# Patient Record
Sex: Female | Born: 1975 | Race: Black or African American | Hispanic: No | Marital: Single | Smoking: Never smoker
Health system: Southern US, Community
[De-identification: ages and names within clinical notes are randomized; demographics above are authoritative.]

## PROBLEM LIST (undated history)

## (undated) DIAGNOSIS — Z789 Other specified health status: Secondary | ICD-10-CM

## (undated) HISTORY — PX: NO PAST SURGERIES: SHX2092

---

## 1999-06-24 ENCOUNTER — Emergency Department (HOSPITAL_COMMUNITY): Admission: EM | Admit: 1999-06-24 | Discharge: 1999-06-24 | Payer: Self-pay | Admitting: Emergency Medicine

## 2001-10-19 ENCOUNTER — Inpatient Hospital Stay (HOSPITAL_COMMUNITY): Admission: AD | Admit: 2001-10-19 | Discharge: 2001-10-19 | Payer: Self-pay | Admitting: Obstetrics and Gynecology

## 2004-04-06 ENCOUNTER — Emergency Department (HOSPITAL_COMMUNITY): Admission: EM | Admit: 2004-04-06 | Discharge: 2004-04-06 | Payer: Self-pay | Admitting: Emergency Medicine

## 2004-04-18 ENCOUNTER — Ambulatory Visit (HOSPITAL_COMMUNITY): Admission: RE | Admit: 2004-04-18 | Discharge: 2004-04-18 | Payer: Self-pay | Admitting: Obstetrics and Gynecology

## 2004-05-27 ENCOUNTER — Encounter: Admission: RE | Admit: 2004-05-27 | Discharge: 2004-05-27 | Payer: Self-pay | Admitting: *Deleted

## 2004-05-29 ENCOUNTER — Ambulatory Visit (HOSPITAL_COMMUNITY): Admission: RE | Admit: 2004-05-29 | Discharge: 2004-05-29 | Payer: Self-pay | Admitting: *Deleted

## 2004-05-29 ENCOUNTER — Ambulatory Visit: Payer: Self-pay | Admitting: Family Medicine

## 2004-06-05 ENCOUNTER — Ambulatory Visit: Payer: Self-pay | Admitting: Family Medicine

## 2004-06-12 ENCOUNTER — Ambulatory Visit: Payer: Self-pay | Admitting: Family Medicine

## 2004-06-26 ENCOUNTER — Ambulatory Visit: Payer: Self-pay | Admitting: Family Medicine

## 2004-07-03 ENCOUNTER — Ambulatory Visit: Payer: Self-pay | Admitting: Family Medicine

## 2004-07-10 ENCOUNTER — Ambulatory Visit (HOSPITAL_COMMUNITY): Admission: RE | Admit: 2004-07-10 | Discharge: 2004-07-10 | Payer: Self-pay | Admitting: *Deleted

## 2004-07-10 ENCOUNTER — Ambulatory Visit: Payer: Self-pay | Admitting: Family Medicine

## 2004-07-17 ENCOUNTER — Ambulatory Visit: Payer: Self-pay | Admitting: Family Medicine

## 2004-07-24 ENCOUNTER — Ambulatory Visit: Payer: Self-pay | Admitting: Family Medicine

## 2004-07-28 ENCOUNTER — Ambulatory Visit: Payer: Self-pay | Admitting: Obstetrics & Gynecology

## 2004-07-31 ENCOUNTER — Inpatient Hospital Stay (HOSPITAL_COMMUNITY): Admission: AD | Admit: 2004-07-31 | Discharge: 2004-08-04 | Payer: Self-pay | Admitting: *Deleted

## 2004-07-31 ENCOUNTER — Ambulatory Visit: Payer: Self-pay | Admitting: Obstetrics and Gynecology

## 2006-12-01 ENCOUNTER — Emergency Department (HOSPITAL_COMMUNITY): Admission: EM | Admit: 2006-12-01 | Discharge: 2006-12-01 | Payer: Self-pay | Admitting: Emergency Medicine

## 2008-08-23 ENCOUNTER — Ambulatory Visit: Payer: Self-pay | Admitting: Obstetrics & Gynecology

## 2008-08-29 ENCOUNTER — Other Ambulatory Visit: Payer: Self-pay | Admitting: Obstetrics & Gynecology

## 2008-09-20 ENCOUNTER — Ambulatory Visit: Payer: Self-pay | Admitting: Family Medicine

## 2008-09-20 ENCOUNTER — Other Ambulatory Visit: Admission: RE | Admit: 2008-09-20 | Discharge: 2008-09-20 | Payer: Self-pay | Admitting: Obstetrics & Gynecology

## 2008-10-04 ENCOUNTER — Ambulatory Visit: Payer: Self-pay | Admitting: Obstetrics and Gynecology

## 2008-11-10 ENCOUNTER — Emergency Department (HOSPITAL_COMMUNITY): Admission: EM | Admit: 2008-11-10 | Discharge: 2008-11-10 | Payer: Self-pay | Admitting: Emergency Medicine

## 2009-04-24 ENCOUNTER — Encounter: Payer: Self-pay | Admitting: Obstetrics and Gynecology

## 2009-04-24 ENCOUNTER — Ambulatory Visit: Payer: Self-pay | Admitting: Obstetrics and Gynecology

## 2009-06-21 ENCOUNTER — Ambulatory Visit: Payer: Self-pay | Admitting: Obstetrics & Gynecology

## 2009-09-25 ENCOUNTER — Ambulatory Visit (HOSPITAL_COMMUNITY): Admission: RE | Admit: 2009-09-25 | Discharge: 2009-09-25 | Payer: Self-pay | Admitting: Obstetrics & Gynecology

## 2010-01-31 ENCOUNTER — Inpatient Hospital Stay (HOSPITAL_COMMUNITY): Admission: AD | Admit: 2010-01-31 | Discharge: 2010-02-02 | Payer: Self-pay | Admitting: Obstetrics & Gynecology

## 2010-01-31 ENCOUNTER — Ambulatory Visit: Payer: Self-pay | Admitting: Advanced Practice Midwife

## 2010-08-28 LAB — CBC
HCT: 36.4 % (ref 36.0–46.0)
Hemoglobin: 11.3 g/dL — ABNORMAL LOW (ref 12.0–15.0)
MCH: 26.7 pg (ref 26.0–34.0)
MCHC: 31.1 g/dL (ref 30.0–36.0)
MCV: 85.9 fL (ref 78.0–100.0)
Platelets: 240 10*3/uL (ref 150–400)
RBC: 4.23 MIL/uL (ref 3.87–5.11)
RDW: 14.3 % (ref 11.5–15.5)
WBC: 7.2 10*3/uL (ref 4.0–10.5)

## 2010-08-28 LAB — RPR: RPR Ser Ql: NONREACTIVE

## 2010-09-24 LAB — POCT PREGNANCY, URINE: Preg Test, Ur: NEGATIVE

## 2010-09-25 LAB — POCT PREGNANCY, URINE: Preg Test, Ur: NEGATIVE

## 2010-10-28 NOTE — Group Therapy Note (Signed)
NAME:  Brooke Lewis, Brooke Lewis NO.:  [id number]   MEDICAL RECORD NO.:  [id number]          PATIENT TYPE:  WOC   LOCATION:  [facility]                   FACILITY:  [facility]   PHYSICIAN:  Argentina Donovan, MD        DATE OF BIRTH:  1975-10-05   DATE OF SERVICE:  09/20/2008                                  CLINIC NOTE   REASON FOR VISIT:  For LEEP procedure.   HISTORY OF PRESENT ILLNESS:  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  The patient does have a history of cryotherapy and her last colposcopy  performed in January 2010 revealed some acetowhite epithelia, was  somewhat concerning for high-grade lesion.  The patient has previously  been counseled on a LEEP and has watched her video.   PROCEDURE NOTE:  Informed consent was obtained and is on the chart.  Then, a speculum was inserted to the patient's vagina and clear  visualization of the cervix was noted.  Under colposcopic exam, no  abnormal blood vessels were noted.  The transformation zone was seen 360  degrees and acetic acid solution was applied to the cervix.  Acetowhite  epithelium was noted at 12 o'clock and 6 o'clock positions.  A  paracervical block was performed using 10 mL of 1% lidocaine with  epinephrine.  Using the Fischer loop electrode, the usual biopsy was  taken from the cervix.  The edges of the biopsy sites were then  cauterized.  Areas of bleeding inside the biopsy site was also  cauterized.  The Monsel solution was applied.  Good hemostasis was  noted.  The patient tolerated procedure well.  Blood loss was minimal.  Procedure was supervised by Dr. Okey Dupre.     ______________________________  Odie Sera, D.O.    ______________________________  Argentina Donovan, MD    MC/MEDQ  D:  09/20/2008  T:  09/21/2008  Job:  [id number]

## 2011-01-01 LAB — HEPATITIS B SURFACE ANTIGEN: Hepatitis B Surface Ag: NEGATIVE

## 2011-01-01 LAB — ABO/RH

## 2011-01-01 LAB — RUBELLA ANTIBODY, IGM: Rubella: IMMUNE

## 2011-01-01 LAB — ANTIBODY SCREEN: Antibody Screen: NEGATIVE

## 2011-04-01 LAB — URINE MICROSCOPIC-ADD ON

## 2011-04-01 LAB — WET PREP, GENITAL: Trich, Wet Prep: NONE SEEN

## 2011-04-01 LAB — GC/CHLAMYDIA PROBE AMP, GENITAL
Chlamydia, DNA Probe: POSITIVE — AB
GC Probe Amp, Genital: NEGATIVE

## 2011-04-01 LAB — URINALYSIS, ROUTINE W REFLEX MICROSCOPIC
Bilirubin Urine: NEGATIVE
Glucose, UA: NEGATIVE
Hgb urine dipstick: NEGATIVE
Ketones, ur: NEGATIVE
Nitrite: NEGATIVE
Protein, ur: NEGATIVE
Specific Gravity, Urine: 1.015
Urobilinogen, UA: 0.2
pH: 5.5

## 2011-04-01 LAB — RPR: RPR Ser Ql: NONREACTIVE

## 2011-04-01 LAB — PREGNANCY, URINE: Preg Test, Ur: NEGATIVE

## 2011-04-28 LAB — GC/CHLAMYDIA PROBE AMP, GENITAL: Gonorrhea: NEGATIVE

## 2011-06-03 LAB — HIV ANTIBODY (ROUTINE TESTING W REFLEX): HIV: NONREACTIVE

## 2011-06-16 NOTE — L&D Delivery Note (Signed)
Delivery Note At 3:03 AM a viable female was delivered via Vaginal, Spontaneous Delivery (Presentation: ;  ).  APGAR: , ; weight 7 lb 7 oz (3374 g).   Placenta status: , .  Cord:  with the following complications: .  Cord pH: not done  Anesthesia:   Episiotomy:  Lacerations:  Suture Repair: 2.0 Est. Blood Loss (mL):   Mom to postpartum.  Baby to nursery-stable.  Joshue Badal A 09/04/2011, 3:16 AM

## 2011-08-18 LAB — STREP B DNA PROBE: GBS: POSITIVE

## 2011-09-04 ENCOUNTER — Encounter (HOSPITAL_COMMUNITY): Payer: Self-pay

## 2011-09-04 ENCOUNTER — Inpatient Hospital Stay (HOSPITAL_COMMUNITY)
Admission: AD | Admit: 2011-09-04 | Discharge: 2011-09-06 | DRG: 775 | Disposition: A | Discharge status: Home / Self Care | Payer: Medicaid Other | Source: Ambulatory Visit | Attending: Obstetrics | Admitting: Obstetrics

## 2011-09-04 HISTORY — DX: Other specified health status: Z78.9

## 2011-09-04 LAB — CBC
Platelets: 247 10*3/uL (ref 150–400)
RBC: 4.34 MIL/uL (ref 3.87–5.11)
WBC: 7.8 10*3/uL (ref 4.0–10.5)

## 2011-09-04 LAB — RPR: RPR Ser Ql: NONREACTIVE

## 2011-09-04 MED ORDER — ONDANSETRON HCL 4 MG/2ML IJ SOLN: 4.0000 mg | INTRAMUSCULAR | Status: DC | PRN

## 2011-09-04 MED ORDER — BENZOCAINE-MENTHOL 20-0.5 % EX AERO: 1.0000 "application " | INHALATION_SPRAY | CUTANEOUS | Status: DC | PRN

## 2011-09-04 MED ORDER — LACTATED RINGERS IV SOLN
500.0000 mL | INTRAVENOUS | Status: DC | PRN
Start: 2011-09-04 — End: 2011-09-04

## 2011-09-04 MED ORDER — FLEET ENEMA 7-19 GM/118ML RE ENEM: 1.0000 | ENEMA | RECTAL | Status: DC | PRN

## 2011-09-04 MED ORDER — SENNOSIDES-DOCUSATE SODIUM 8.6-50 MG PO TABS
2.0000 | ORAL_TABLET | Freq: Every day | ORAL | Status: DC
  Administered 2011-09-05 (×2): 2 via ORAL

## 2011-09-04 MED ORDER — LANOLIN HYDROUS EX OINT: TOPICAL_OINTMENT | CUTANEOUS | Status: DC | PRN

## 2011-09-04 MED ORDER — CITRIC ACID-SODIUM CITRATE 334-500 MG/5ML PO SOLN: 30.0000 mL | ORAL | Status: DC | PRN

## 2011-09-04 MED ORDER — OXYCODONE-ACETAMINOPHEN 5-325 MG PO TABS: 1.0000 | ORAL_TABLET | ORAL | Status: DC | PRN

## 2011-09-04 MED ORDER — ONDANSETRON HCL 4 MG PO TABS: 4.0000 mg | ORAL_TABLET | ORAL | Status: DC | PRN

## 2011-09-04 MED ORDER — OXYTOCIN BOLUS FROM INFUSION
500.0000 mL | Freq: Once | INTRAVENOUS | Status: DC
  Filled 2011-09-04: qty 500
  Filled 2011-09-04: qty 1000

## 2011-09-04 MED ORDER — TETANUS-DIPHTH-ACELL PERTUSSIS 5-2.5-18.5 LF-MCG/0.5 IM SUSP
0.5000 mL | Freq: Once | INTRAMUSCULAR | Status: AC
  Administered 2011-09-05: 0.5 mL via INTRAMUSCULAR
  Filled 2011-09-04: qty 0.5

## 2011-09-04 MED ORDER — LIDOCAINE HCL (PF) 1 % IJ SOLN: 30.0000 mL | INTRAMUSCULAR | Status: DC | PRN

## 2011-09-04 MED ORDER — OXYCODONE-ACETAMINOPHEN 5-325 MG PO TABS
1.0000 | ORAL_TABLET | ORAL | Status: DC | PRN
  Administered 2011-09-04: 1 via ORAL
  Filled 2011-09-04: qty 1

## 2011-09-04 MED ORDER — ZOLPIDEM TARTRATE 5 MG PO TABS: 5.0000 mg | ORAL_TABLET | Freq: Every evening | ORAL | Status: DC | PRN

## 2011-09-04 MED ORDER — ACETAMINOPHEN 325 MG PO TABS: 650.0000 mg | ORAL_TABLET | ORAL | Status: DC | PRN

## 2011-09-04 MED ORDER — IBUPROFEN 600 MG PO TABS
600.0000 mg | ORAL_TABLET | Freq: Four times a day (QID) | ORAL | Status: DC
  Administered 2011-09-04 – 2011-09-06 (×9): 600 mg via ORAL
  Filled 2011-09-04 (×11): qty 1

## 2011-09-04 MED ORDER — FERROUS SULFATE 325 (65 FE) MG PO TABS
325.0000 mg | ORAL_TABLET | Freq: Two times a day (BID) | ORAL | Status: DC
  Administered 2011-09-04 – 2011-09-06 (×4): 325 mg via ORAL
  Filled 2011-09-04 (×5): qty 1

## 2011-09-04 MED ORDER — LACTATED RINGERS IV SOLN: INTRAVENOUS | Status: DC

## 2011-09-04 MED ORDER — IBUPROFEN 600 MG PO TABS: 600.0000 mg | ORAL_TABLET | Freq: Four times a day (QID) | ORAL | Status: DC | PRN

## 2011-09-04 MED ORDER — BUTORPHANOL TARTRATE 2 MG/ML IJ SOLN: 1.0000 mg | INTRAMUSCULAR | Status: DC | PRN

## 2011-09-04 MED ORDER — DIPHENHYDRAMINE HCL 25 MG PO CAPS: 25.0000 mg | ORAL_CAPSULE | Freq: Four times a day (QID) | ORAL | Status: DC | PRN

## 2011-09-04 MED ORDER — SIMETHICONE 80 MG PO CHEW: 80.0000 mg | CHEWABLE_TABLET | ORAL | Status: DC | PRN

## 2011-09-04 MED ORDER — PRENATAL MULTIVITAMIN CH
1.0000 | ORAL_TABLET | Freq: Every day | ORAL | Status: DC
  Administered 2011-09-04 – 2011-09-06 (×3): 1 via ORAL
  Filled 2011-09-04 (×2): qty 1

## 2011-09-04 MED ORDER — DIBUCAINE 1 % RE OINT: 1.0000 "application " | TOPICAL_OINTMENT | RECTAL | Status: DC | PRN

## 2011-09-04 MED ORDER — ONDANSETRON HCL 4 MG/2ML IJ SOLN: 4.0000 mg | Freq: Four times a day (QID) | INTRAMUSCULAR | Status: DC | PRN

## 2011-09-04 MED ORDER — WITCH HAZEL-GLYCERIN EX PADS: 1.0000 "application " | MEDICATED_PAD | CUTANEOUS | Status: DC | PRN

## 2011-09-04 MED ORDER — OXYTOCIN 20 UNITS IN LACTATED RINGERS INFUSION - SIMPLE
125.0000 mL/h | Freq: Once | INTRAVENOUS | Status: AC
  Administered 2011-09-04: 125 mL/h via INTRAVENOUS

## 2011-09-04 NOTE — H&P (Signed)
This is Dr. Francoise Ceo dictating the history and physical on  Brooke Lewis she's a 36 year old gravida 3 para 2002 who is due date was 09/18/2011 she was admitted in labor and when she got to labor and delivery she was fully dilated her membranes ruptured spontaneously and the fluid was meconium-stained showed a normal vaginal liver a female Apgar 49 placenta was spontaneous in act there were no lacerations Past medical history negative Past surgical history negative Social history negative System review negative Physical exam well-developed female in no distress HEENT negative Breasts negative Heart regular with no murmurs no gallops Abdomen uterus 20 week size postpartum Extremities negative and and

## 2011-09-04 NOTE — Progress Notes (Signed)
UR Chart review completed.  

## 2011-09-04 NOTE — MAU Note (Signed)
Pt states having contractions 5 minutes apart since 0030 this am. Denies leaking of fluid. States having small amount of pinkish discharge

## 2011-09-04 NOTE — Progress Notes (Signed)
Nursing Note:  Fundal massage resulting in expulsion of a blood clot approximately the size of an orange. Patient tolerated massage well. Stated she felt better after clot was expelled. States she has history of postpartum hemorrhage with her second child. Educated patient about the importance of notifying the nurse if she had feelings of blood gushing or passing of  clots. Will continue to monitor for S/S of hemorrhage.   Brooke Lewis

## 2011-09-05 LAB — CBC
Hemoglobin: 9.6 g/dL — ABNORMAL LOW (ref 12.0–15.0)
MCH: 25.2 pg — ABNORMAL LOW (ref 26.0–34.0)
MCHC: 32 g/dL (ref 30.0–36.0)
Platelets: 237 10*3/uL (ref 150–400)
RDW: 16.1 % — ABNORMAL HIGH (ref 11.5–15.5)

## 2011-09-05 NOTE — Progress Notes (Signed)
Patient ID: Brooke Lewis, female   DOB: [birth year], 36 y.o.   MRN: [id number] Post Partum Day 1 S/P spontaneous vaginal RH status/Rubella reviewed.  Feeding: breast Subjective: No HA, SOB, CP, F/C, breast symptoms. Normal vaginal bleeding, no clots.     Objective: BP 104/66  Pulse 81  Temp(Src) 98 F (36.7 C) (Oral)  Resp 20  Ht 5\' 7"  (1.702 m)  Wt 94.348 kg (208 lb)  BMI 32.58 kg/m2  LMP 12/12/2010  Breastfeeding? Unknown   Physical Exam:  General: alert Lochia: appropriate Uterine Fundus: firm DVT Evaluation: No evidence of DVT seen on physical exam. Ext: No c/c/e  Basename 09/05/11 0500 09/04/11 0250  HGB 9.6* 10.8*  HCT 30.0* 33.9*      Assessment/Plan: 36 y.o.  PPD #1 .  normal postpartum exam Continue current postpartum care Ambulate   LOS: 1 day   JACKSON-MOORE,Kaio Kuhlman A 09/05/2011, 10:06 AM

## 2011-09-06 MED ORDER — OXYCODONE-ACETAMINOPHEN 5-325 MG PO TABS: 1.0000 | ORAL_TABLET | ORAL | Status: AC | PRN

## 2011-09-06 MED ORDER — PRENATAL MULTIVITAMIN CH: 1.0000 | ORAL_TABLET | Freq: Every morning | ORAL | Status: DC

## 2011-09-06 MED ORDER — NORETHINDRONE 0.35 MG PO TABS: 1.0000 | ORAL_TABLET | Freq: Every day | ORAL | Status: AC

## 2011-09-06 NOTE — Discharge Instructions (Signed)

## 2011-09-06 NOTE — Discharge Summary (Signed)
Obstetric Discharge Summary Reason for Admission: onset of labor Prenatal Procedures: none Intrapartum Procedures: spontaneous vaginal delivery Postpartum Procedures: none Complications-Operative and Postpartum: none Hemoglobin  Date Value Range Status  09/05/2011 9.6* 12.0-15.0 (g/dL) Final     HCT  Date Value Range Status  09/05/2011 30.0* 36.0-46.0 (%) Final    Physical Exam:  General: alert Lochia: appropriate Uterine Fundus: firm Incision: N/A DVT Evaluation: No evidence of DVT seen on physical exam.  Discharge Diagnoses: Term Pregnancy-delivered  Discharge Information: Date: 09/06/2011 Activity: pelvic rest Diet: routine Medications: PNV, Percocet and Micronor Condition: stable Instructions: See above Discharge to: home Follow-up Information    Follow up with Antionette Char A, MD. Schedule an appointment as soon as possible for a visit in 2 weeks.   Contact information:   [address], [address] [place] [address] [phone]          Newborn Data: Live born female  Birth Weight: 7 lb 7 oz (3374 g) APGAR: 9, 9  Home with mother.  Lewis,Brooke Nazario A 09/06/2011, 8:59 AM

## 2013-01-02 ENCOUNTER — Other Ambulatory Visit: Payer: Self-pay

## 2013-01-02 LAB — OB RESULTS CONSOLE HIV ANTIBODY (ROUTINE TESTING): HIV: NONREACTIVE

## 2013-01-02 LAB — OB RESULTS CONSOLE RPR: RPR: NONREACTIVE

## 2013-01-02 LAB — OB RESULTS CONSOLE ABO/RH: RH TYPE: POSITIVE

## 2013-01-02 LAB — OB RESULTS CONSOLE ANTIBODY SCREEN: ANTIBODY SCREEN: NEGATIVE

## 2013-01-02 LAB — OB RESULTS CONSOLE HEPATITIS B SURFACE ANTIGEN: Hepatitis B Surface Ag: NEGATIVE

## 2013-01-02 LAB — OB RESULTS CONSOLE GC/CHLAMYDIA
Chlamydia: NEGATIVE
Gonorrhea: NEGATIVE

## 2013-01-02 LAB — OB RESULTS CONSOLE RUBELLA ANTIBODY, IGM: Rubella: IMMUNE

## 2013-01-05 ENCOUNTER — Other Ambulatory Visit: Payer: Self-pay | Admitting: Nurse Practitioner

## 2013-01-05 DIAGNOSIS — O09529 Supervision of elderly multigravida, unspecified trimester: Secondary | ICD-10-CM

## 2013-01-10 ENCOUNTER — Encounter (HOSPITAL_COMMUNITY): Payer: Self-pay

## 2013-01-10 ENCOUNTER — Ambulatory Visit (HOSPITAL_COMMUNITY)
Admission: RE | Admit: 2013-01-10 | Discharge: 2013-01-10 | Disposition: A | Discharge status: Home / Self Care | Payer: Medicaid Other | Source: Ambulatory Visit | Attending: Nurse Practitioner | Admitting: Nurse Practitioner

## 2013-01-10 ENCOUNTER — Other Ambulatory Visit: Payer: Self-pay

## 2013-01-10 DIAGNOSIS — Z8751 Personal history of pre-term labor: Secondary | ICD-10-CM | POA: Insufficient documentation

## 2013-01-10 DIAGNOSIS — Z363 Encounter for antenatal screening for malformations: Secondary | ICD-10-CM | POA: Insufficient documentation

## 2013-01-10 DIAGNOSIS — O289 Unspecified abnormal findings on antenatal screening of mother: Secondary | ICD-10-CM | POA: Insufficient documentation

## 2013-01-10 DIAGNOSIS — O358XX0 Maternal care for other (suspected) fetal abnormality and damage, not applicable or unspecified: Secondary | ICD-10-CM | POA: Insufficient documentation

## 2013-01-10 DIAGNOSIS — O09529 Supervision of elderly multigravida, unspecified trimester: Secondary | ICD-10-CM | POA: Insufficient documentation

## 2013-01-10 DIAGNOSIS — Z1389 Encounter for screening for other disorder: Secondary | ICD-10-CM | POA: Insufficient documentation

## 2013-01-10 DIAGNOSIS — O09299 Supervision of pregnancy with other poor reproductive or obstetric history, unspecified trimester: Secondary | ICD-10-CM | POA: Insufficient documentation

## 2013-01-10 NOTE — Progress Notes (Addendum)
Genetic Counseling  High-Risk Gestation Note  Appointment Date:  01/10/2013 Referred By: Brooke Lewis,* Date of Birth:  [birth year] Partner:  Brooke Lewis    Pregnancy History: [id number] Estimated Date of Delivery: 06/22/13 Estimated Gestational Age: [redacted]w[redacted]d Attending: Particia Nearing, MD    [name] and her partner, Mr. Brooke Lewis, were seen for genetic counseling regarding a maternal age of 37 y.o. and an increased risk for Down syndrome based on Quad screen performed through [facility].  They were counseled regarding maternal age and the association with risk for chromosome conditions due to nondisjunction with aging of the ova.   We reviewed chromosomes, nondisjunction, and the associated 1 in 141 risk for fetal aneuploidy related to a maternal age of 37 y.o. at [redacted]w[redacted]d gestation.  They were counseled that the risk for aneuploidy decreases as gestational age increases, accounting for those pregnancies which spontaneously abort.  We specifically discussed Down syndrome (trisomy 71), trisomies 24 and 80, and sex chromosome aneuploidies (47,XXX and 47,XXY) including the common features and prognoses of each.   We also reviewed Brooke Lewis's maternal serum Quad screen result and the associated increase in risk for fetal Down syndrome (1 in 392 to 1 in 200).  We reviewed that the initial result indicated a risk of 1 in 8 for fetal Down syndrome. However, today's ultrasound visualized the pregnancy to be [redacted]w[redacted]d gestation, and thus, the Quad screen was recalculated with this corrected gestational age, giving a risk of 1 in 200 for fetal Down syndrome. They were counseled regarding other explanations for a screen positive result including normal variation and differences in maternal metabolism.  In addition, we reviewed the screen adjusted reduction in risks for trisomy 18 (1 in 1,179 to < 1 in 10,000) and ONTDs.  They understand that Quad screening provides a  pregnancy specific risk for Down syndrome, but is not considered to be diagnostic.    We reviewed other available screening options including noninvasive prenatal screening (NIPS)/cell free fetal DNA testing and detailed ultrasound.  Specifically, we discussed that NIPS analyzes cell free fetal DNA found in the maternal circulation. This test is not diagnostic for chromosome conditions, but can provide information regarding the presence or absence of extra fetal DNA for chromosomes 13, 18, 21, X, and Y, and missing fetal DNA for chromosome X and Y (Turner syndrome). Thus, it would not identify or rule out all genetic conditions. The reported detection rate is greater than 99% for Trisomy 21, Trisomy 18, and Trisomy 13. The false positive rate is reported to be less than 0.1% for any of these conditions.  We reviewed the risks, benefits, and limitations of these screening options.   This couple was also counseled regarding diagnostic testing via amniocentesis.  We reviewed the approximate 1 in 300-500 risk for complications, including spontaneous pregnancy loss. After consideration of all the options, she elected to proceed with NIPS (Panorama) at the time of today's visit. Those results will be available in approximately 8-10 days.  Detailed ultrasound was performed today.  The ultrasound report will be sent under separate cover. Follow-up ultrasound was planned in 4 weeks.   [name] was provided with written information regarding sickle cell anemia (SCA) including the carrier frequency and incidence in the African-American population, the availability of carrier testing and prenatal diagnosis if indicated. 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  We discussed that SCA affects the shape and function of the red blood cell  by producing abnormal hemoglobin. They were counseled that hemoglobin is a protein in the RBCs that carries oxygen to the body's organs. Individuals who have  SCA have a changes within the genes that codes for hemoglobin. This couple was counseled that SCA is inherited in an autosomal recessive manner, and occurs when both copies of the hemoglobin gene are changed and produce an abnormal hemoglobin S. Typically, one abnormal gene for the production of hemoglobin S is inherited from each parent. A carrier of SCA has one altered copy of the gene for hemoglobin and one typical working copy. Carriers of recessive conditions typically do not have symptoms related to the condition because they still have one functioning copy of the gene, and thus some production of the typical protein coded for by that gene.  Given the recessive inheritance, we discussed the importance of understanding Mr. Brooke Lewis carrier status in order to accurately predict the risk of a hemoglobinopathy in the fetus. The father of the pregnancy reported that he has had testing for SCA in the past and that he does NOT have sickle cell trait. He did not have medical records available to verify the reported information. We discussed that other hemoglobin variants (hemoglobin C), when combined with hemoglobin S, can cause a medically significant hemoglobinopathy. Repeat testing (hemoglobin electrophoresis) is available, if desired, to the father of the pregnancy to determine whether he has any hemoglobin variant, including sickle cell trait (SCT).  They understand that an accurate risk assessment cannot be performed without further information. However, assuming that the father of the pregnancy does not have sickle cell trait or any other hemoglobin variant, the fetus would not be expected to have an increased risk for a hemoglobinopathy. We also reviewed the availability of newborn screening in [place] for hemoglobinopathies.   Both family histories were reviewed and found to be noncontributory for birth defects, mental retardation, and known genetic conditions. Without further information regarding  the provided family history, an accurate genetic risk cannot be calculated. Further genetic counseling is warranted if more information is obtained.  [name] denied exposure to environmental toxins or chemical agents. She denied the use of alcohol, tobacco or street drugs. She denied significant viral illnesses during the course of her pregnancy. Her medical and surgical histories were noncontributory.     I counseled this couple regarding the above risks and available options.  The approximate face-to-face time with the genetic counselor was 30 minutes.    Quinn Plowman, MS,  Certified Genetic Counselor 01/10/2013

## 2013-01-19 ENCOUNTER — Telehealth (HOSPITAL_COMMUNITY): Payer: Self-pay | Admitting: MS"

## 2013-01-19 NOTE — Telephone Encounter (Signed)
Called Keena Dinse to discuss her cell free fetal DNA test results.  Mrs. Arby Barrette had Panorama testing through [facility].  Testing was offered because of advanced maternal age and abnormal Quad screen result.   The patient was identified by name and DOB.  We reviewed that these are within normal limits, showing a less than 1 in 10,000 risk for trisomies 21, 18 and 13, and monosomy X (Turner syndrome).  In addition, the risk for triploidy/vanishing twin and sex chromosome trisomies (47,XXX and 47,XXY) was also low risk. We reviewed that this testing identifies > 99% of pregnancies with trisomy 23, trisomy 68, trisomy 37, sex chromosome trisomies (47,XXX and 47,XXY), and triploidy.  The detection rate for monosomy X is ~92%.  The false positive rate is <0.1% for all conditions. Testing was also consistent with female  gender.  We discussed that this testing does not identify all genetic conditions.  All questions were answered to her satisfaction, she was encouraged to call with additional questions or concerns.  Quinn Plowman, MS Certified Genetic Counselor 01/19/2013 11:17 AM

## 2013-02-06 ENCOUNTER — Other Ambulatory Visit: Payer: Self-pay | Admitting: Nurse Practitioner

## 2013-02-06 DIAGNOSIS — O289 Unspecified abnormal findings on antenatal screening of mother: Secondary | ICD-10-CM

## 2013-02-06 DIAGNOSIS — O09529 Supervision of elderly multigravida, unspecified trimester: Secondary | ICD-10-CM

## 2013-02-07 ENCOUNTER — Ambulatory Visit (HOSPITAL_COMMUNITY)
Admission: RE | Admit: 2013-02-07 | Discharge: 2013-02-07 | Disposition: A | Discharge status: Home / Self Care | Payer: Medicaid Other | Source: Ambulatory Visit | Attending: Nurse Practitioner | Admitting: Nurse Practitioner

## 2013-02-07 VITALS — BP 104/60 | HR 82 | Wt 208.0 lb

## 2013-02-07 DIAGNOSIS — O09299 Supervision of pregnancy with other poor reproductive or obstetric history, unspecified trimester: Secondary | ICD-10-CM | POA: Insufficient documentation

## 2013-02-07 DIAGNOSIS — O09529 Supervision of elderly multigravida, unspecified trimester: Secondary | ICD-10-CM | POA: Insufficient documentation

## 2013-02-07 DIAGNOSIS — Z8751 Personal history of pre-term labor: Secondary | ICD-10-CM | POA: Insufficient documentation

## 2013-02-07 DIAGNOSIS — Z3689 Encounter for other specified antenatal screening: Secondary | ICD-10-CM | POA: Insufficient documentation

## 2013-02-07 DIAGNOSIS — O289 Unspecified abnormal findings on antenatal screening of mother: Secondary | ICD-10-CM

## 2013-02-07 NOTE — Progress Notes (Signed)
Brooke Lewis  was seen today for an ultrasound appointment.  See full report in [facility].  Impression: Single IUP at 20 5/7 weeks AMA, increased risk for T21 by Quad screen with low risk cell free fetal DNA Normal interval anatomy; the fetal anatomic survey is now complete No markers associated with aneuploidy noted Normal amniotic fluic volume  Recommendations: Follow-up ultrasounds as clinically indicated.   Alpha Gula, MD

## 2013-05-04 ENCOUNTER — Encounter: Payer: Self-pay | Admitting: *Deleted

## 2013-06-02 LAB — OB RESULTS CONSOLE GBS: STREP GROUP B AG: POSITIVE

## 2013-06-15 NOTE — L&D Delivery Note (Signed)
Delivery Note At 4:49 AM a viable female was delivered via Vaginal, Spontaneous Delivery (Presentation: Left Occiput Anterior).  APGAR: 9,9 ; weight 6+6 lbs.   Placenta status: Intact, no evidence of retained pieces or abruption, sent for pathology.  Cord: 3 vessels; also subjectively short in length.  Anesthesia: None  Episiotomy: None Lacerations: None  Suture Repair: N/A Est. Blood Loss (mL): 300 mL   Mom to postpartum.  Baby to Couplet care / Skin to Skin.  Twana FirstBryan R. Hess, DO [facility] 06/22/2013, 5:04 AM  I have seen and examined this patient and I agree with the above. Plans for BTL later this AM- will be kept NPO. Cam HaiSHAW, Omeed Osuna 5:20 AM 06/22/2013

## 2013-06-15 NOTE — L&D Delivery Note (Signed)
Attestation of Attending Supervision of Advanced Practitioner: Evaluation and management procedures were performed by the PA/NP/CNM/OB Fellow under my supervision/collaboration. Chart reviewed and agree with management and plan.  Cesily Cuoco V 06/27/2013 10:45 AM

## 2013-06-21 ENCOUNTER — Encounter (HOSPITAL_COMMUNITY): Payer: Self-pay | Admitting: *Deleted

## 2013-06-21 ENCOUNTER — Inpatient Hospital Stay (HOSPITAL_COMMUNITY)
Admission: AD | Admit: 2013-06-21 | Discharge: 2013-06-23 | DRG: 767 | Disposition: A | Discharge status: Home / Self Care | Payer: Medicaid Other | Source: Ambulatory Visit | Attending: Obstetrics and Gynecology | Admitting: Obstetrics and Gynecology

## 2013-06-21 DIAGNOSIS — Z302 Encounter for sterilization: Secondary | ICD-10-CM

## 2013-06-21 DIAGNOSIS — O9902 Anemia complicating childbirth: Principal | ICD-10-CM | POA: Diagnosis present

## 2013-06-21 DIAGNOSIS — IMO0001 Reserved for inherently not codable concepts without codable children: Secondary | ICD-10-CM

## 2013-06-21 DIAGNOSIS — O9989 Other specified diseases and conditions complicating pregnancy, childbirth and the puerperium: Secondary | ICD-10-CM

## 2013-06-21 DIAGNOSIS — O99892 Other specified diseases and conditions complicating childbirth: Secondary | ICD-10-CM | POA: Diagnosis present

## 2013-06-21 DIAGNOSIS — O09529 Supervision of elderly multigravida, unspecified trimester: Secondary | ICD-10-CM | POA: Diagnosis present

## 2013-06-21 DIAGNOSIS — Z22330 Carrier of Group B streptococcus: Secondary | ICD-10-CM

## 2013-06-21 DIAGNOSIS — D573 Sickle-cell trait: Secondary | ICD-10-CM | POA: Diagnosis present

## 2013-06-21 HISTORY — DX: Other specified health status: Z78.9

## 2013-06-21 NOTE — MAU Note (Signed)
Pt states she has been having contractions since 1400. Pt states she was seen at the clinic on Monday

## 2013-06-22 ENCOUNTER — Encounter (HOSPITAL_COMMUNITY): Payer: Medicaid Other | Admitting: Anesthesiology

## 2013-06-22 ENCOUNTER — Encounter (HOSPITAL_COMMUNITY)
Admission: AD | Disposition: A | Discharge status: Home / Self Care | Payer: Self-pay | Source: Ambulatory Visit | Attending: Obstetrics and Gynecology

## 2013-06-22 ENCOUNTER — Encounter (HOSPITAL_COMMUNITY): Payer: Self-pay

## 2013-06-22 ENCOUNTER — Inpatient Hospital Stay (HOSPITAL_COMMUNITY): Payer: Medicaid Other | Admitting: Anesthesiology

## 2013-06-22 DIAGNOSIS — O9989 Other specified diseases and conditions complicating pregnancy, childbirth and the puerperium: Secondary | ICD-10-CM

## 2013-06-22 DIAGNOSIS — Z302 Encounter for sterilization: Secondary | ICD-10-CM

## 2013-06-22 DIAGNOSIS — O9902 Anemia complicating childbirth: Secondary | ICD-10-CM

## 2013-06-22 DIAGNOSIS — IMO0001 Reserved for inherently not codable concepts without codable children: Secondary | ICD-10-CM

## 2013-06-22 DIAGNOSIS — O09529 Supervision of elderly multigravida, unspecified trimester: Secondary | ICD-10-CM

## 2013-06-22 DIAGNOSIS — O99892 Other specified diseases and conditions complicating childbirth: Secondary | ICD-10-CM

## 2013-06-22 DIAGNOSIS — D573 Sickle-cell trait: Secondary | ICD-10-CM

## 2013-06-22 HISTORY — PX: TUBAL LIGATION: SHX77

## 2013-06-22 LAB — CBC
HCT: 36.5 % (ref 36.0–46.0)
HEMOGLOBIN: 12.4 g/dL (ref 12.0–15.0)
MCH: 27.3 pg (ref 26.0–34.0)
MCHC: 34 g/dL (ref 30.0–36.0)
MCV: 80.2 fL (ref 78.0–100.0)
PLATELETS: 226 10*3/uL (ref 150–400)
RBC: 4.55 MIL/uL (ref 3.87–5.11)
RDW: 14.6 % (ref 11.5–15.5)
WBC: 7.6 10*3/uL (ref 4.0–10.5)

## 2013-06-22 LAB — RPR: RPR Ser Ql: NONREACTIVE

## 2013-06-22 SURGERY — LIGATION, FALLOPIAN TUBE, POSTPARTUM
Anesthesia: Spinal | Site: Abdomen

## 2013-06-22 MED ORDER — PENICILLIN G POTASSIUM 5000000 UNITS IJ SOLR
2.5000 10*6.[IU] | INTRAVENOUS | Status: DC
  Filled 2013-06-22 (×6): qty 2.5

## 2013-06-22 MED ORDER — DIBUCAINE 1 % RE OINT: 1.0000 "application " | TOPICAL_OINTMENT | RECTAL | Status: DC | PRN

## 2013-06-22 MED ORDER — OXYCODONE-ACETAMINOPHEN 5-325 MG PO TABS
1.0000 | ORAL_TABLET | ORAL | Status: DC | PRN
Start: 2013-06-22 — End: 2013-06-24

## 2013-06-22 MED ORDER — LACTATED RINGERS IV SOLN: 500.0000 mL | INTRAVENOUS | Status: DC | PRN

## 2013-06-22 MED ORDER — IBUPROFEN 600 MG PO TABS
600.0000 mg | ORAL_TABLET | Freq: Four times a day (QID) | ORAL | Status: DC | PRN
  Administered 2013-06-22: 600 mg via ORAL
  Filled 2013-06-22: qty 1

## 2013-06-22 MED ORDER — WITCH HAZEL-GLYCERIN EX PADS: 1.0000 "application " | MEDICATED_PAD | CUTANEOUS | Status: DC | PRN

## 2013-06-22 MED ORDER — LACTATED RINGERS IV SOLN
INTRAVENOUS | Status: DC
  Administered 2013-06-22 (×4): via INTRAVENOUS

## 2013-06-22 MED ORDER — ONDANSETRON HCL 4 MG/2ML IJ SOLN: 4.0000 mg | INTRAMUSCULAR | Status: DC | PRN

## 2013-06-22 MED ORDER — ACETAMINOPHEN 325 MG PO TABS: 650.0000 mg | ORAL_TABLET | ORAL | Status: DC | PRN

## 2013-06-22 MED ORDER — PROMETHAZINE HCL 25 MG/ML IJ SOLN: 6.2500 mg | INTRAMUSCULAR | Status: DC | PRN

## 2013-06-22 MED ORDER — FENTANYL CITRATE 0.05 MG/ML IJ SOLN
INTRAMUSCULAR | Status: AC
  Filled 2013-06-22: qty 2

## 2013-06-22 MED ORDER — MIDAZOLAM HCL 2 MG/2ML IJ SOLN: 0.5000 mg | Freq: Once | INTRAMUSCULAR | Status: DC | PRN

## 2013-06-22 MED ORDER — OXYTOCIN 40 UNITS IN LACTATED RINGERS INFUSION - SIMPLE MED
62.5000 mL/h | INTRAVENOUS | Status: DC
  Filled 2013-06-22: qty 1000

## 2013-06-22 MED ORDER — FENTANYL CITRATE 0.05 MG/ML IJ SOLN: 50.0000 ug | INTRAMUSCULAR | Status: DC | PRN

## 2013-06-22 MED ORDER — OXYTOCIN BOLUS FROM INFUSION
500.0000 mL | INTRAVENOUS | Status: DC
  Administered 2013-06-22: 500 mL via INTRAVENOUS

## 2013-06-22 MED ORDER — CITRIC ACID-SODIUM CITRATE 334-500 MG/5ML PO SOLN
30.0000 mL | ORAL | Status: DC | PRN
  Administered 2013-06-22: 30 mL via ORAL
  Filled 2013-06-22: qty 15

## 2013-06-22 MED ORDER — LANOLIN HYDROUS EX OINT: TOPICAL_OINTMENT | CUTANEOUS | Status: DC | PRN

## 2013-06-22 MED ORDER — MEPERIDINE HCL 25 MG/ML IJ SOLN: 6.2500 mg | INTRAMUSCULAR | Status: DC | PRN

## 2013-06-22 MED ORDER — PENICILLIN G POTASSIUM 5000000 UNITS IJ SOLR
5.0000 10*6.[IU] | Freq: Once | INTRAVENOUS | Status: AC
  Administered 2013-06-22: 5 10*6.[IU] via INTRAVENOUS
  Filled 2013-06-22: qty 5

## 2013-06-22 MED ORDER — FENTANYL CITRATE 0.05 MG/ML IJ SOLN
INTRAMUSCULAR | Status: AC
  Administered 2013-06-22: 50 ug via INTRAVENOUS
  Filled 2013-06-22: qty 2

## 2013-06-22 MED ORDER — BUPIVACAINE HCL (PF) 0.5 % IJ SOLN
INTRAMUSCULAR | Status: AC
  Filled 2013-06-22: qty 30

## 2013-06-22 MED ORDER — SENNOSIDES-DOCUSATE SODIUM 8.6-50 MG PO TABS
2.0000 | ORAL_TABLET | ORAL | Status: DC
  Administered 2013-06-23: 2 via ORAL
  Filled 2013-06-22: qty 2

## 2013-06-22 MED ORDER — BUPIVACAINE IN DEXTROSE 0.75-8.25 % IT SOLN
INTRATHECAL | Status: DC | PRN
  Administered 2013-06-22: 1.6 mL via INTRATHECAL

## 2013-06-22 MED ORDER — FLEET ENEMA 7-19 GM/118ML RE ENEM: 1.0000 | ENEMA | RECTAL | Status: DC | PRN

## 2013-06-22 MED ORDER — SIMETHICONE 80 MG PO CHEW: 80.0000 mg | CHEWABLE_TABLET | ORAL | Status: DC | PRN

## 2013-06-22 MED ORDER — LIDOCAINE HCL (PF) 1 % IJ SOLN
30.0000 mL | INTRAMUSCULAR | Status: DC | PRN
  Filled 2013-06-22: qty 30

## 2013-06-22 MED ORDER — MIDAZOLAM HCL 2 MG/2ML IJ SOLN
INTRAMUSCULAR | Status: AC
  Filled 2013-06-22: qty 2

## 2013-06-22 MED ORDER — BUPIVACAINE HCL 0.5 % IJ SOLN
INTRAMUSCULAR | Status: DC | PRN
  Administered 2013-06-22: 20 mL

## 2013-06-22 MED ORDER — ONDANSETRON HCL 4 MG/2ML IJ SOLN: 4.0000 mg | Freq: Four times a day (QID) | INTRAMUSCULAR | Status: DC | PRN

## 2013-06-22 MED ORDER — IBUPROFEN 600 MG PO TABS
600.0000 mg | ORAL_TABLET | Freq: Four times a day (QID) | ORAL | Status: DC
  Administered 2013-06-22 – 2013-06-23 (×5): 600 mg via ORAL
  Filled 2013-06-22 (×5): qty 1

## 2013-06-22 MED ORDER — ONDANSETRON HCL 4 MG PO TABS: 4.0000 mg | ORAL_TABLET | ORAL | Status: DC | PRN

## 2013-06-22 MED ORDER — PRENATAL MULTIVITAMIN CH
1.0000 | ORAL_TABLET | Freq: Every day | ORAL | Status: DC
  Administered 2013-06-23: 1 via ORAL
  Filled 2013-06-22: qty 1

## 2013-06-22 MED ORDER — MIDAZOLAM HCL 2 MG/2ML IJ SOLN
INTRAMUSCULAR | Status: DC | PRN
  Administered 2013-06-22 (×2): 1 mg via INTRAVENOUS

## 2013-06-22 MED ORDER — KETOROLAC TROMETHAMINE 30 MG/ML IJ SOLN
15.0000 mg | Freq: Once | INTRAMUSCULAR | Status: AC | PRN
  Administered 2013-06-22: 30 mg via INTRAVENOUS

## 2013-06-22 MED ORDER — TETANUS-DIPHTH-ACELL PERTUSSIS 5-2.5-18.5 LF-MCG/0.5 IM SUSP: 0.5000 mL | Freq: Once | INTRAMUSCULAR | Status: DC

## 2013-06-22 MED ORDER — PRENATAL MULTIVITAMIN CH: 1.0000 | ORAL_TABLET | Freq: Every morning | ORAL | Status: DC

## 2013-06-22 MED ORDER — BENZOCAINE-MENTHOL 20-0.5 % EX AERO: 1.0000 "application " | INHALATION_SPRAY | CUTANEOUS | Status: DC | PRN

## 2013-06-22 MED ORDER — DIPHENHYDRAMINE HCL 25 MG PO CAPS: 25.0000 mg | ORAL_CAPSULE | Freq: Four times a day (QID) | ORAL | Status: DC | PRN

## 2013-06-22 MED ORDER — FENTANYL CITRATE 0.05 MG/ML IJ SOLN
25.0000 ug | INTRAMUSCULAR | Status: DC | PRN
  Administered 2013-06-22: 50 ug via INTRAVENOUS

## 2013-06-22 MED ORDER — ZOLPIDEM TARTRATE 5 MG PO TABS
5.0000 mg | ORAL_TABLET | Freq: Every evening | ORAL | Status: DC | PRN
Start: 2013-06-22 — End: 2013-06-24

## 2013-06-22 MED ORDER — OXYCODONE-ACETAMINOPHEN 5-325 MG PO TABS: 1.0000 | ORAL_TABLET | ORAL | Status: DC | PRN

## 2013-06-22 MED ORDER — KETOROLAC TROMETHAMINE 30 MG/ML IJ SOLN
INTRAMUSCULAR | Status: AC
  Filled 2013-06-22: qty 1

## 2013-06-22 MED ORDER — BUPIVACAINE IN DEXTROSE 0.75-8.25 % IT SOLN
INTRATHECAL | Status: AC
  Filled 2013-06-22: qty 2

## 2013-06-22 SURGICAL SUPPLY — 23 items
APL SKNCLS STERI-STRIP NONHPOA (GAUZE/BANDAGES/DRESSINGS) ×1
BENZOIN TINCTURE PRP APPL 2/3 (GAUZE/BANDAGES/DRESSINGS) ×2 IMPLANT
CATH ROBINSON RED A/P 16FR (CATHETERS) ×3 IMPLANT
CHLORAPREP W/TINT 26ML (MISCELLANEOUS) ×3 IMPLANT
CLIP FILSHIE TUBAL LIGA STRL (Clip) ×2 IMPLANT
CLOSURE WOUND 1/2 X4 (GAUZE/BANDAGES/DRESSINGS) ×1
CLOTH BEACON ORANGE TIMEOUT ST (SAFETY) ×3 IMPLANT
DRSG TEGADERM 2.38X2.75 (GAUZE/BANDAGES/DRESSINGS) ×2 IMPLANT
GLOVE ECLIPSE 7.0 STRL STRAW (GLOVE) ×3 IMPLANT
GLOVE INDICATOR 7.0 STRL GRN (GLOVE) ×6 IMPLANT
GOWN PREVENTION PLUS LG XLONG (DISPOSABLE) ×3 IMPLANT
GOWN STRL REIN XL XLG (GOWN DISPOSABLE) ×3 IMPLANT
NDL HYPO 25X1 1.5 SAFETY (NEEDLE) IMPLANT
NEEDLE HYPO 25X1 1.5 SAFETY (NEEDLE) ×3 IMPLANT
NS IRRIG 1000ML POUR BTL (IV SOLUTION) ×3 IMPLANT
PACK ABDOMINAL MINOR (CUSTOM PROCEDURE TRAY) ×3 IMPLANT
STRIP CLOSURE SKIN 1/2X4 (GAUZE/BANDAGES/DRESSINGS) ×1 IMPLANT
SUT VIC AB 0 CT1 27 (SUTURE) ×3
SUT VIC AB 0 CT1 27XBRD ANBCTR (SUTURE) ×1 IMPLANT
SUT VIC AB 4-0 PS2 27 (SUTURE) ×3 IMPLANT
SYR CONTROL 10ML LL (SYRINGE) ×2 IMPLANT
TOWEL OR 17X24 6PK STRL BLUE (TOWEL DISPOSABLE) ×6 IMPLANT
WATER STERILE IRR 1000ML POUR (IV SOLUTION) ×3 IMPLANT

## 2013-06-22 NOTE — Anesthesia Postprocedure Evaluation (Signed)
Anesthesia Post Note  Patient: Brooke BarretteDana Lewis  Procedure(s) Performed: Procedure(s) (LRB): POST PARTUM TUBAL LIGATION (N/A)  Anesthesia type: Epidural  Patient location: Mother/Baby  Post pain: Pain level controlled  Post assessment: Post-op Vital signs reviewed  Last Vitals:  Filed Vitals:   06/22/13 1705  BP: 122/70  Pulse: 88  Temp: 37 C  Resp: 16    Post vital signs: Reviewed  Level of consciousness:alert  Complications: No apparent anesthesia complications

## 2013-06-22 NOTE — H&P (Signed)
  No changes. Patient desires surgical management with postpartum tubal ligation with Filshie Clips.  The risks of surgery were discussed in detail with the patient including but not limited to: bleeding which may require transfusion or reoperation; infection which may require prolonged hospitalization or re-hospitalization and antibiotic therapy; injury to bowel, bladder, ureters and major vessels or other surrounding organs; need for additional procedures including laparotomy; thromboembolic phenomenon, incisional problems and other postoperative or anesthesia complications.Patient was told that the likelihood that her condition and symptoms will be treated effectively with this surgical management was very high; the postoperative expectations were also discussed in detail. The patient also understands the alternative treatment options which were discussed in full. All questions were answered.

## 2013-06-22 NOTE — Lactation Note (Signed)
This note was copied from the chart of Brooke Arby BarretteDana Gabor. Lactation Consultation Note Initial visit at 17 hours of age.  [facility] given and discussed.  Baby has had a void and 2 stools.  Baby has breast fed several times with latch scores of 9-10.  Mom has experience with 3 olds boys all breast fed for 1 year.  Mom attempts hand expression, but no visible colostrum at this time.  Mom aware of early feeding cues and cluster feeding.  She has large nipples and feels baby can get a deep latch and denies pain.  Mom to call for assist as needed.   Baby asleep in crib.    Patient Name: Brooke Lewis Reason for consult: Initial assessment   Maternal Data    Feeding    LATCH Score/Interventions                      Lactation Tools Discussed/Used     Consult Status Consult Status: Follow-up Date: 06/23/13 Follow-up type: In-patient    Brooke Lewis, Brooke Lewis Lewis, 10:14 PM

## 2013-06-22 NOTE — Transfer of Care (Signed)
Immediate Anesthesia Transfer of Care Note  Patient: Brooke Lewis  Procedure(s) Performed: Procedure(s): POST PARTUM TUBAL LIGATION (N/A)  Patient Location: PACU  Anesthesia Type:Spinal  Level of Consciousness: awake, alert , oriented and patient cooperative  Airway & Oxygen Therapy: Patient Spontanous Breathing  Post-op Assessment: Report given to PACU RN and Post -op Vital signs reviewed and stable  Post vital signs: Reviewed and stable  Complications: No apparent anesthesia complications

## 2013-06-22 NOTE — H&P (Signed)
`````  Attestation of Attending Supervision of Advanced Practitioner: Evaluation and management procedures were performed by the PA/NP/CNM/OB Fellow under my supervision/collaboration. Chart reviewed and agree with management and plan.  Loren Sawaya V 06/22/2013 9:39 AM

## 2013-06-22 NOTE — Anesthesia Postprocedure Evaluation (Signed)
  Anesthesia Post Note  Patient: Brooke BarretteDana Tsao  Procedure(s) Performed: Procedure(s) (LRB): POST PARTUM TUBAL LIGATION (N/A)  Anesthesia type: Spinal  Patient location: PACU  Post pain: Pain level controlled  Post assessment: Post-op Vital signs reviewed  Last Vitals:  Filed Vitals:   06/22/13 1115  BP: 95/60  Pulse: 98  Temp:   Resp: 19    Post vital signs: Reviewed  Level of consciousness: awake  Complications: No apparent anesthesia complications

## 2013-06-22 NOTE — Progress Notes (Signed)
UR chart review completed.  

## 2013-06-22 NOTE — Anesthesia Preprocedure Evaluation (Signed)
Anesthesia Evaluation  Patient identified by MRN, date of birth, ID band Patient awake    Reviewed: Allergy & Precautions, H&P , NPO status , Patient's Chart, lab work & pertinent test results  Airway Mallampati: II      Dental   Pulmonary  breath sounds clear to auscultation        Cardiovascular Exercise Tolerance: Good Rhythm:regular Rate:Normal     Neuro/Psych    GI/Hepatic   Endo/Other    Renal/GU      Musculoskeletal   Abdominal   Peds  Hematology   Anesthesia Other Findings   Reproductive/Obstetrics                           Anesthesia Physical Anesthesia Plan  ASA: II  Anesthesia Plan: Spinal   Post-op Pain Management:    Induction:   Airway Management Planned:   Additional Equipment:   Intra-op Plan:   Post-operative Plan:   Informed Consent: I have reviewed the patients History and Physical, chart, labs and discussed the procedure including the risks, benefits and alternatives for the proposed anesthesia with the patient or authorized representative who has indicated his/her understanding and acceptance.     Plan Discussed with: Anesthesiologist, CRNA and Surgeon  Anesthesia Plan Comments:         Anesthesia Quick Evaluation

## 2013-06-22 NOTE — Brief Op Note (Signed)
06/21/2013 - 06/22/2013  11:10 AM  PATIENT:  Brooke Lewis  38 y.o. female  PRE-OPERATIVE DIAGNOSIS:  Desires Sterilization  POST-OPERATIVE DIAGNOSIS:  Desires Sterilization  PROCEDURE:  Procedure(s): POST PARTUM TUBAL LIGATION (N/A)  SURGEON:  Surgeon(s) and Role:    * Willodean Rosenthalarolyn Harraway-Smith, MD - Primary  ANESTHESIA:   spinal  EBL:  Total I/O In: 1200 [I.V.:1200] Out: 255 [Urine:175; Blood:80]  BLOOD ADMINISTERED:none  DRAINS: none   LOCAL MEDICATIONS USED:  MARCAINE     SPECIMEN:  No Specimen  DISPOSITION OF SPECIMEN:  N/A  COUNTS:  YES  TOURNIQUET:  * No tourniquets in log *  DICTATION: .Note written in EPIC  PLAN OF CARE: Admit to inpatient   PATIENT DISPOSITION:  PACU - hemodynamically stable.   Delay start of Pharmacological VTE agent (>24hrs) due to surgical blood loss or risk of bleeding: not applicable

## 2013-06-22 NOTE — Anesthesia Procedure Notes (Signed)
Spinal  Patient location during procedure: OR Start time: 06/22/2013 10:33 AM Staffing Anesthesiologist: Brayton CavesJACKSON, Mckenzie Bove Performed by: anesthesiologist  Preanesthetic Checklist Completed: patient identified, site marked, surgical consent, pre-op evaluation, timeout performed, IV checked, risks and benefits discussed and monitors and equipment checked Spinal Block Patient position: sitting Prep: DuraPrep Patient monitoring: heart rate, cardiac monitor, continuous pulse ox and blood pressure Approach: midline Location: L3-4 Injection technique: single-shot Needle Needle type: Sprotte  Needle gauge: 24 G Needle length: 9 cm Assessment Sensory level: T4 Additional Notes Patient identified.  Risk benefits discussed including failed block, incomplete pain control, headache, nerve damage, paralysis, blood pressure changes, nausea, vomiting, reactions to medication both toxic or allergic, and postpartum back pain.  Patient expressed understanding and wished to proceed.  All questions were answered.  Sterile technique used throughout procedure.  CSF was clear.  No parasthesia or other complications.  Please see nursing notes for vital signs.

## 2013-06-22 NOTE — H&P (Signed)
Brooke Lewis is a 38 y.o. female G4 P3003 @ 40.0 days presenting for contractions and labor evaluation. Maternal Medical History:  Reason for admission: Contractions.  Nausea.  Contractions: Onset was 6-12 hours ago.   Frequency: regular.   Duration is approximately 8 minutes.   Perceived severity is mild.    Fetal activity: Perceived fetal activity is normal.   Last perceived fetal movement was within the past hour.    Prenatal Complications - Diabetes: none.   Hx of GDM, LEEP in 2010, and Sickle Cell Trait  OB History   Grav Para Term Preterm Abortions TAB SAB Ect Mult Living   4 3 3       3      Past Medical History  Diagnosis Date  . No pertinent past medical history   . Medical history non-contributory    Past Surgical History  Procedure Laterality Date  . No past surgeries     Family History: family history is not on file. Social History:  reports that she has never smoked. She has never used smokeless tobacco. She reports that she does not drink alcohol or use illicit drugs.   Prenatal Transfer Tool  Maternal Diabetes: No Genetic Screening: Normal Maternal Ultrasounds/Referrals: Normal Fetal Ultrasounds or other Referrals:  Referred to Materal Fetal Medicine  for AMA Maternal Substance Abuse:  No Significant Maternal Medications:  None Significant Maternal Lab Results:  Lab values include: Group B Strep positive, Other:  Other Comments:  Sickle Cell Trait  Review of Systems  Constitutional: Negative.   Eyes: Negative for double vision, photophobia and pain.  Cardiovascular: Negative for chest pain.  Gastrointestinal: Negative for nausea and vomiting.  Genitourinary: Negative for dysuria.  Musculoskeletal: Negative for myalgias.  Neurological: Negative for headaches.  All other systems reviewed and are negative.    Dilation: 4 Effacement (%): 90 Station: -2 Exam by:: Alisia FerrariBelinda Bethea RN Last menstrual period 08/19/2012. Maternal Exam:  Uterine  Assessment: Contraction strength is moderate.  Contraction duration is 30 seconds. Contraction frequency is regular.   Abdomen: Patient reports no abdominal tenderness. Fetal presentation: vertex  Introitus: Normal vulva. Normal vagina.  Ferning test: not done.  Nitrazine test: not done. Amniotic fluid character: not assessed.  Pelvis: adequate for delivery.   Cervix: Cervix evaluated by digital exam.     Physical Exam  Constitutional: She appears well-developed and well-nourished. No distress.  Cardiovascular: Normal rate and regular rhythm.   Respiratory: Effort normal and breath sounds normal.    Prenatal labs: ABO, Rh:   O +  Antibody:   Neg Rubella:   Neg RPR:    Neg , Repeat Neg  HBsAg:   Immune  HIV:   Neg GBS:   +  Assessment/Plan: 1) Active Labor - Admit to BS, normal order set  - GBS +, non PCN allergic, PCN 5 follwed by 2.5 q 4 hrs - Epidural per request  - BTL s/p delivery, papers signed 05/01/13  Twana FirstBryan R. Hess, [facility] 06/22/2013, 1:01 AM  I have seen and examined this patient and I agree with the above. Cam HaiSHAW, KIMBERLY 4:42 AM 06/22/2013

## 2013-06-22 NOTE — Op Note (Signed)
06/21/2013 - 06/22/2013  11:10 AM  PATIENT:  Brooke Lewis  38 y.o. female  PRE-OPERATIVE DIAGNOSIS:  Desires Sterilization  POST-OPERATIVE DIAGNOSIS:  Desires Sterilization  PROCEDURE:  Procedure(s): POST PARTUM TUBAL LIGATION (N/A)  SURGEON:  Surgeon(s) and Role:    * Willodean Rosenthalarolyn Harraway-Smith, MD - Primary  ANESTHESIA:   spinal  EBL:  Total I/O In: 1200 [I.V.:1200] Out: 255 [Urine:175; Blood:80]  BLOOD ADMINISTERED:none  DRAINS: none   LOCAL MEDICATIONS USED:  MARCAINE     SPECIMEN:  No Specimen  DISPOSITION OF SPECIMEN:  N/A  COUNTS:  YES  TOURNIQUET:  * No tourniquets in log *  DICTATION: .Note written in EPIC  PLAN OF CARE: Admit to inpatient   PATIENT DISPOSITION:  PACU - hemodynamically stable.   Delay start of Pharmacological VTE agent (>24hrs) due to surgical blood loss or risk of bleeding: not applicable  INDICATIONS: 38 y.o. [id number]  with undesired fertility,status post vaginal delivery, desires permanent sterilization.  Other reversible forms of contraception were discussed with patient; she declines all other modalities. Risks of procedure discussed with patient including but not limited to: risk of regret, permanence of method, bleeding, infection, injury to surrounding organs and need for additional procedures.  Failure risk of 0.5-1% with increased risk of ectopic gestation if pregnancy occurs was also discussed with patient.     FINDINGS:  Normal uterus, tubes, and ovaries.  PROCEDURE DETAILS: The patient was taken to the operating room where her epidural anesthesia was dosed up to surgical level and found to be adequate.  She was then placed in the dorsal supine position and prepped and draped in sterile fashion.  After an adequate timeout was performed, attention was turned to the patient's abdomen where a small transverse skin incision was made under the umbilical fold. The incision was taken down to the layer of fascia using the scalpel, and fascia was  incised, and extended bilaterally using Mayo scissors. The peritoneum was entered in a sharp fashion. Attention was then turned to the patient's uterus, and left fallopian tube was identified and followed out to the fimbriated end.  A Filshie clip was placed on the left fallopian tube about 3 cm from the cornual attachment, with care given to incorporate the underlying mesosalpinx.  A similar process was carried out on the right side allowing for bilateral tubal sterilization.  Good hemostasis was noted overall.  The instruments were then removed from the patient's abdomen and the fascial incision was repaired with 0 Vicryl, and the skin was closed with a 4-0 Vicryl subcuticular stitch. 20cc of 0.5% Marcaine was injected into the incision. The patient tolerated the procedure well.  Instrument, sponge, and needle counts were correct times two.  The patient was then taken to the recovery room awake and in stable condition.

## 2013-06-23 ENCOUNTER — Encounter (HOSPITAL_COMMUNITY): Payer: Self-pay | Admitting: Obstetrics & Gynecology

## 2013-06-23 LAB — CBC
HCT: 32.5 % — ABNORMAL LOW (ref 36.0–46.0)
HEMOGLOBIN: 10.9 g/dL — AB (ref 12.0–15.0)
MCH: 27 pg (ref 26.0–34.0)
MCHC: 33.5 g/dL (ref 30.0–36.0)
MCV: 80.6 fL (ref 78.0–100.0)
Platelets: 221 10*3/uL (ref 150–400)
RBC: 4.03 MIL/uL (ref 3.87–5.11)
RDW: 14.7 % (ref 11.5–15.5)
WBC: 7.6 10*3/uL (ref 4.0–10.5)

## 2013-06-23 MED ORDER — IBUPROFEN 600 MG PO TABS: 600.0000 mg | ORAL_TABLET | Freq: Four times a day (QID) | ORAL | Status: DC

## 2013-06-23 NOTE — Discharge Summary (Signed)
Obstetric Discharge Summary Reason for Admission: onset of labor Prenatal Procedures: ultrasound Intrapartum Procedures: spontaneous vaginal delivery Postpartum Procedures: P.P. tubal ligation Complications-Operative and Postpartum: none  Patient had spontaneous onset of labor.  Delivered vaginally without complications.  Had a BTL yesterday.  Postpartum coarse has been uneventful. The baby is breastfeeding well.   Hemoglobin  Date Value Range Status  06/23/2013 10.9* 12.0 - 15.0 g/dL Final     HCT  Date Value Range Status  06/23/2013 32.5* 36.0 - 46.0 % Final    Physical Exam:  General: alert and cooperative Lochia: appropriate Uterine Fundus: firm Incision: Incision on abdomen d/t BTL is healing well DVT Evaluation: No evidence of DVT seen on physical exam.  Discharge Diagnoses: Term Pregnancy-delivered  Discharge Information: Date: 06/23/2013 Activity: unrestricted Diet: routine Medications: PNV Condition: stable Instructions: refer to practice specific booklet Discharge to: home Follow-up Information   [facility] [place]. Schedule an appointment as soon as possible for a visit in 6 weeks.   Contact information:   [address] [place] [address] [phone]      Newborn Data: Live born female  Birth Weight: 6 lb 6 oz (2892 g) APGAR: 9, 9  Home with mother.  Brooke Lewis, Brooke Lewis 06/23/2013, 7:36 AM  I have seen and examined this patient and agree the above assessment. CRESENZO-DISHMAN,Avaya Mcjunkins 06/23/2013 7:57 AM

## 2013-06-24 NOTE — Discharge Summary (Signed)
Attestation of Attending Supervision of Advanced Practitioner (CNM/NP): Evaluation and management procedures were performed by the Advanced Practitioner under my supervision and collaboration.  I have reviewed the Advanced Practitioner's note and chart, and I agree with the management and plan.  HARRAWAY-SMITH, Deion Swift 8:07 AM     

## 2013-07-05 ENCOUNTER — Ambulatory Visit (HOSPITAL_COMMUNITY)
Admission: RE | Admit: 2013-07-05 | Discharge: 2013-07-05 | Disposition: A | Discharge status: Home / Self Care | Payer: Medicaid Other | Source: Ambulatory Visit | Attending: Obstetrics | Admitting: Obstetrics

## 2013-07-05 NOTE — Lactation Note (Addendum)
Adult Lactation Consultation Outpatient Visit Note  Patient Name: Brooke BarretteDana Gohr Date of Birth: 1976-04-06 Gestational Age at Delivery: Unknown Type of Delivery: Vaginal delivery 06/22/2013 P4 , experienced breast feeder , per mom this baby has  been a challenged  Due to baby not opening her mouth due to my large nipples. And sore nipples on the sides.  BW - 6-6 oz  D/C weight  1st Dr. Visit 1/12- 5-14 oz [facility] home visit 1/16 - 5-13 oz  Today's visit for - Feeding assessment and assessment of sore nipples  Breastfeeding History: Frequency of Breastfeeding: per mom stopped breast feeding and started pumping and bottle feeding Sunday , received  the DEBP last  Friday . Sore nipples are improving , but still sore.  Length of Feeding:  Voids: 5 Stools: 3 yellowish seedy stools   Supplementing / Method: Pumping:  Type of Pump:DEBP ( Latina )    Frequency:every 2-3 hours   Volume:  1 - 2 oz per mom               Last pumped at 1330 with 1 oz yield   Comments:Has been using #24 Flange , mom reports discomfort , increased to #27 ( 2 provided for home )     Consultation Evaluation: LC assessed breast tissue , breast full bilaterally, no engorgement noted,  Nipples large and mom showed me cracking on the sides of the nipples, Per mom has been using lanolin  To sooth the discomfort and has only been pumping since this past Sunday with a DEBP Latina form her. Baby Love RN " [facility] . See plan of Care below .   Initial Feeding Assessment:( mom is only pumping and bottle feeding EBM due to her large nipples ,  baby's small mouth and sore nipples bilaterally. Pre-feed Weight:6-4.9 oz , wet diaper changed - re-weight - 6-4.2 oz 2842 g   Comments:Baby last fed at 1230 - 2 oz oz of EBM per mom . @ consult baby awake , alert and rooting , Mom came  prepared and fed her 60 ml of EBM with a broad based nipple. Lips flanged open and baby tolerating the nipple well . Fed 2 oz in about 12 -15 mins , no spit  up noted.    Total Breast milk Transferred this Visit: baby received breast milk mom brought to consult.  Total Supplement Given: 60 ml EBM in a bottle ( Moms preference )   Lactation Feeding Plan of Care - Praised mom for her efforts providing Expressed milk for her baby .                                                        - Mom - Naps, rest ,plenty fluids , especially water , nutritious snacks and meals                                                         - Feedings - Every 2-3 hours ( look for feeding cues ) , Cluster feedings are normal especially with growth spurts                                                         -  Sore nipple tx - Comfort gels ( cold - for the next 6 days after pumping )                                                                                     - Expressed milk to the nipples around pumping time and in between                                                                                    - Can use small amount olive oil to nipples areolas prior to pumping .                                                                                    - Increase flange to #27 for pumping ( due to the size of nipples )                                                        - Pumping - Every 2-3 hours / Try your best not to go over 4 hours - establish and protect milk supply                                                                           - Pump for 15 -20 mins with breast massage and hand expressing                                                                           - avoid watching pump [pieces while pumping.                                                                          -  Total pumping in 24 should = 8-10 x's and when necessary.    Follow-Up- per mom F/U with Dr. Jacklynn Barnacle 07/26/13 for 1 month check up .                    - Per mom the nurse from the health department will probably come back out to see me                        After her week of vacation this week                     - LC recommended if the sore nipples aren't better by 4-6 days with plan of car to call [facility] office back for F/U apt.       Kathrin Greathouse 07/05/2013, 2:59 PM

## 2013-07-06 ENCOUNTER — Ambulatory Visit (HOSPITAL_COMMUNITY): Payer: Medicaid Other

## 2013-11-14 ENCOUNTER — Encounter: Payer: Self-pay | Admitting: *Deleted

## 2014-03-25 IMAGING — US US OB DETAIL+14 WK
1 series · 12 of 28 positions shown · non-contrast
Comparison: none

[Series 1: us ob detail+14 wk · 0.23mm/px · 12 of 63 slices shown]
[im 3/63]
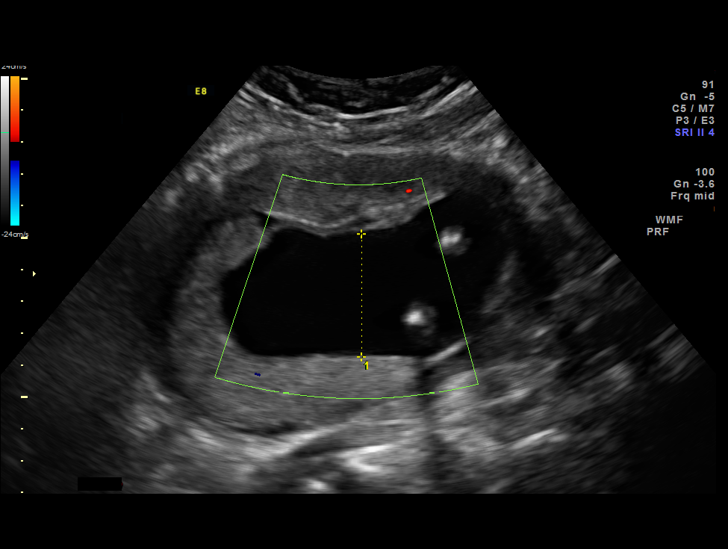
[im 7/63]
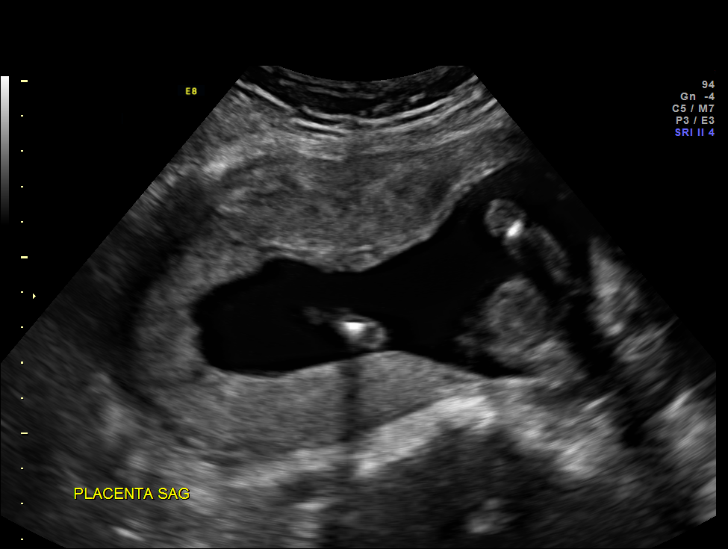
[im 12/63]
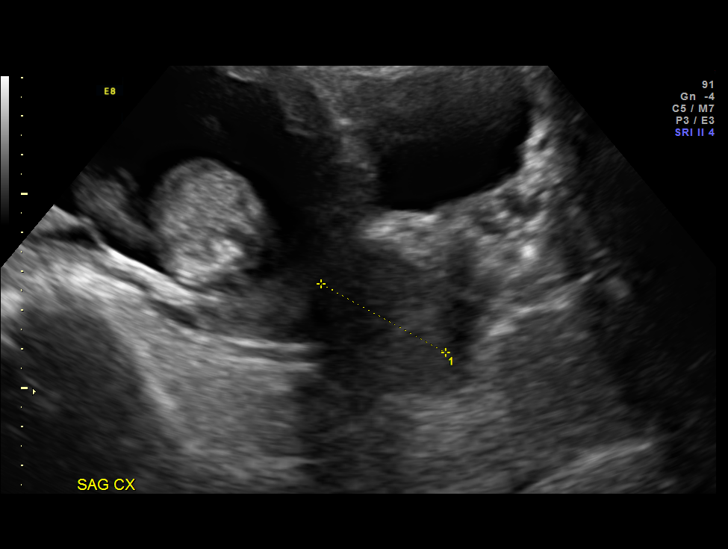
[im 19/63]
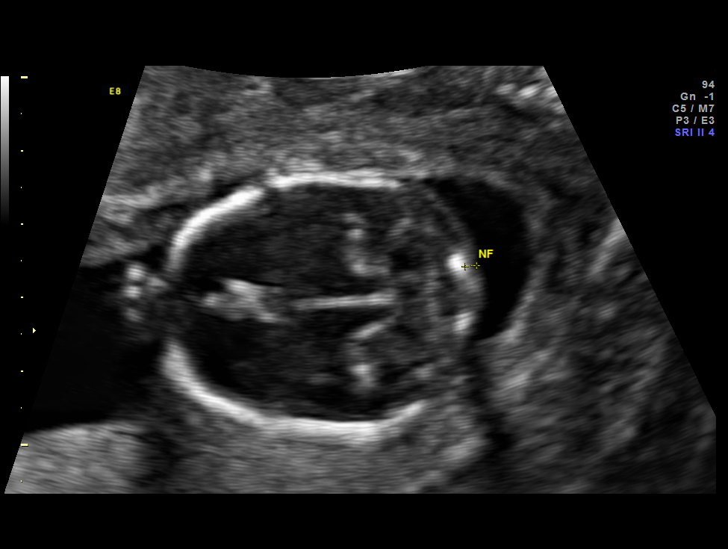
[im 23/63]
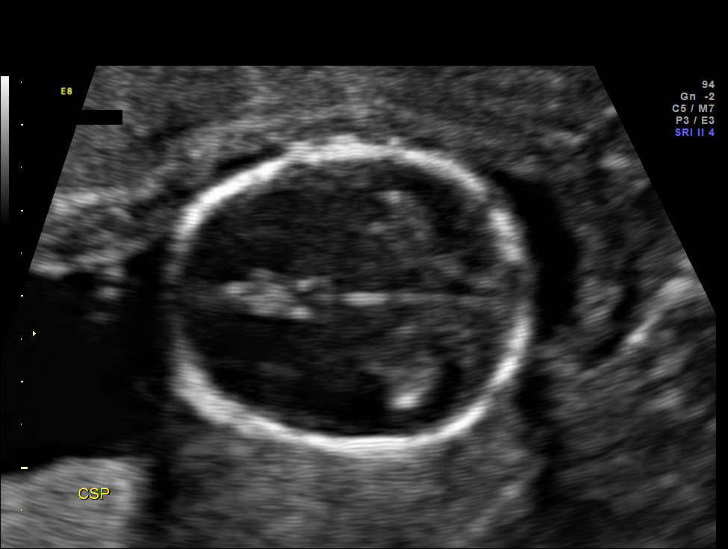
[im 28/63]
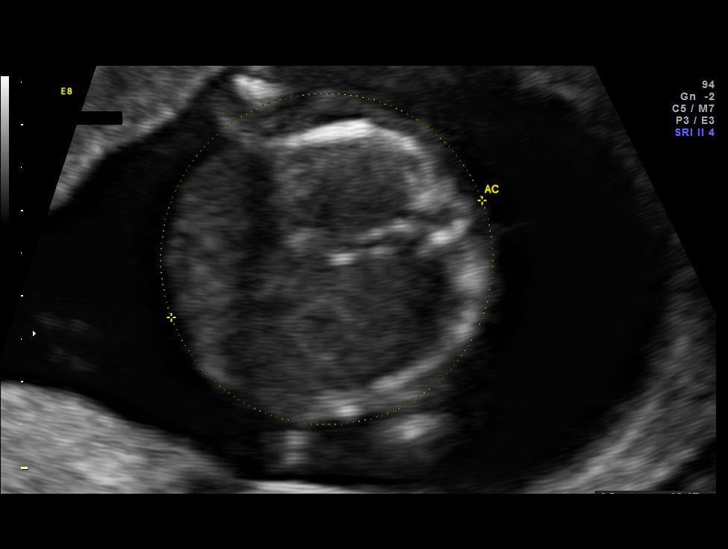
[im 35/63]
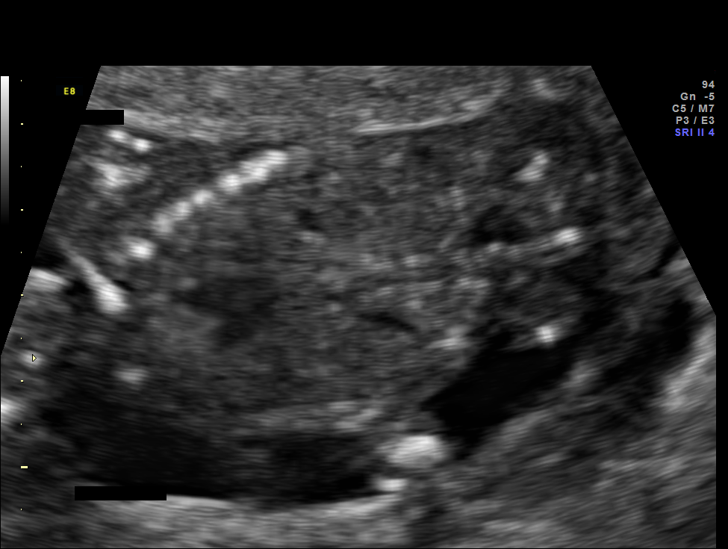
[im 40/63]
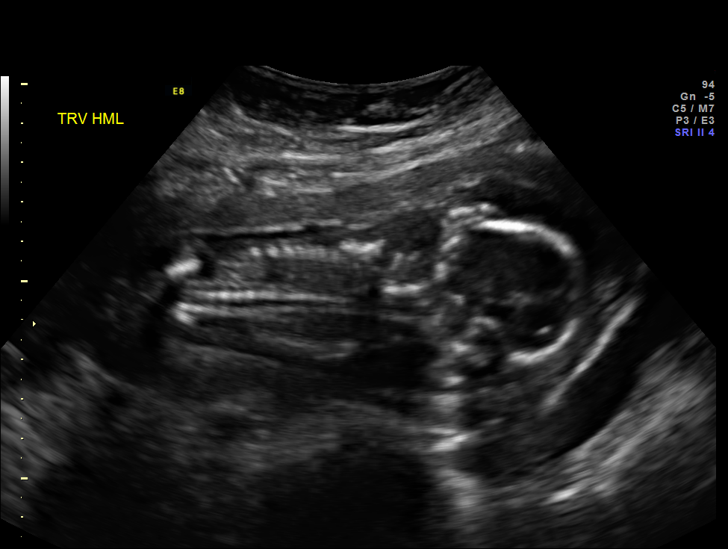
[im 44/63]
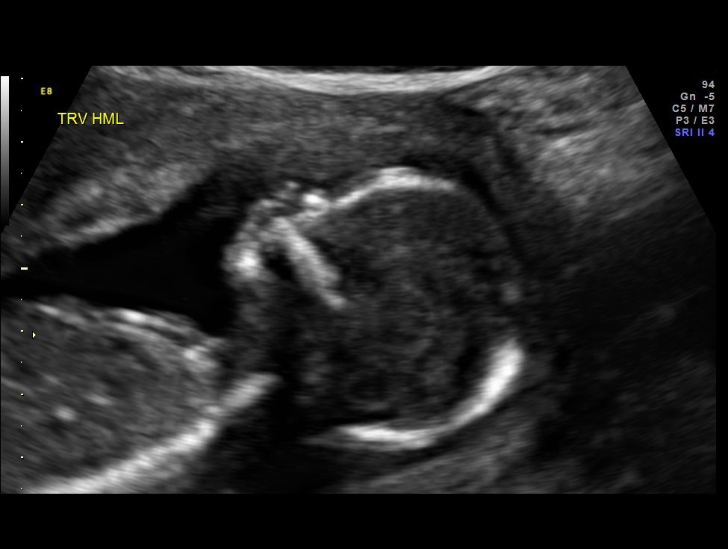
[im 51/63]
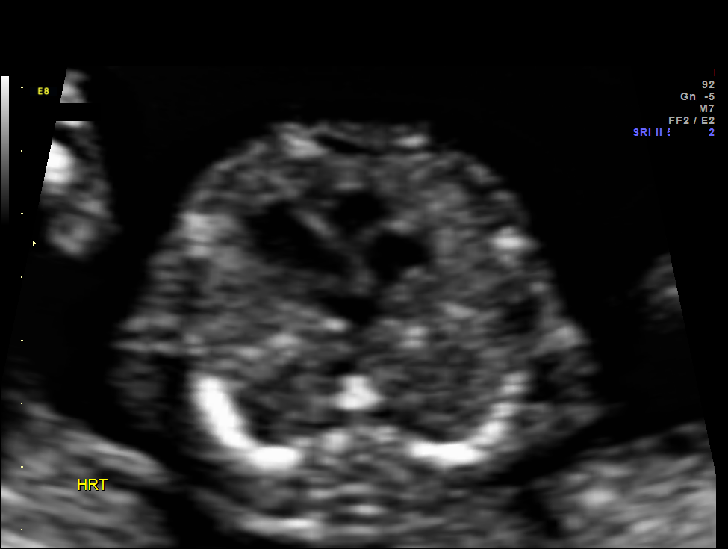
[im 56/63]
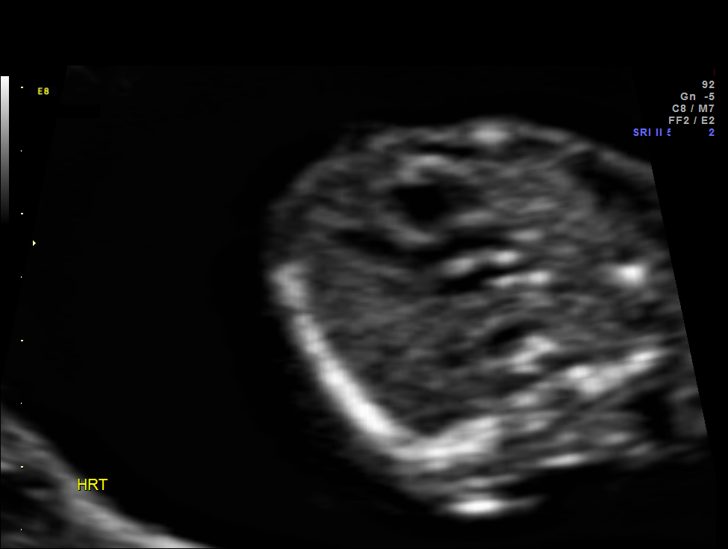
[im 60/63]
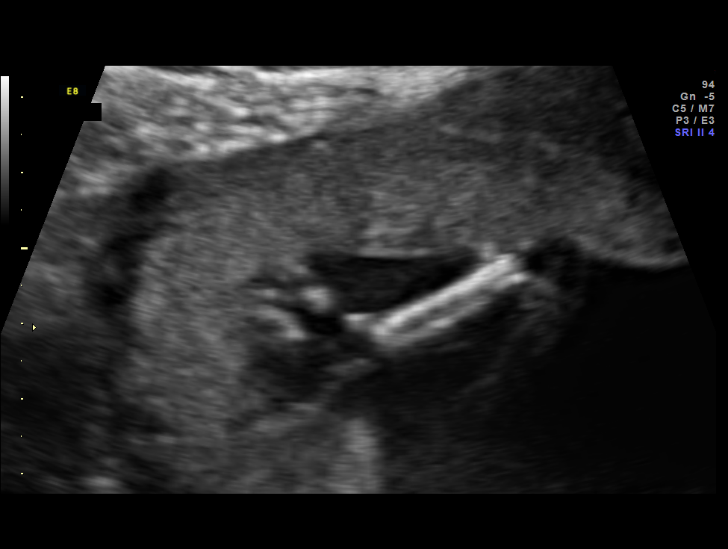

[12 of 28 positions shown; findings below may reference images not displayed]

OBSTETRICS REPORT
                      (Signed Final 01/10/2013 [DATE])

Service(s) Provided

 US OB DETAIL + 14 WK                                  76811.0
Indications

 Detailed fetal anatomic survey
 Advanced maternal age (37), Multigravida
 Poor obstetric history: Previous preterm delivery
 (36 weeks)
 Poor obstetric history: Previous gestational
 diabetes
 Sickle cell trait
 Abnormal biochemical screen (quad) for Trisomy
Fetal Evaluation

 Num Of Fetuses:    1
 Fetal Heart Rate:  138                          bpm
 Cardiac Activity:  Observed
 Presentation:      Variable
 Placenta:          Posterior Fundal, above
                    cervical os
 P. Cord            Visualized, central
 Insertion:

 Amniotic Fluid
 AFI FV:      Subjectively within normal limits
                                             Larg Pckt:     3.8  cm
Biometry

 BPD:     34.2  mm     G. Age:  16w 4d                CI:         79.7   70 - 86
 OFD:     42.9  mm                                    FL/HC:      18.1   14.6 -

 HC:     124.6  mm     G. Age:  16w 2d       19  %    HC/AC:      1.05   1.07 -

 AC:     118.7  mm     G. Age:  17w 4d       78  %    FL/BPD:
 FL:      22.6  mm     G. Age:  16w 5d       48  %    FL/AC:      19.0   20 - 24
 HUM:     22.9  mm     G. Age:  17w 0d       64  %
 CER:     16.8  mm     G. Age:  16w 6d       52  %
 NFT:      1.5  mm

 Est. FW:     181  gm      0 lb 6 oz     67  %
Gestational Age

 LMP:           20w 4d        Date:  08/19/12                 EDD:   05/26/13
 U/S Today:     16w 5d                                        EDD:   06/22/13
 Best:          16w 5d     Det. By:  U/S (01/10/13)           EDD:   06/22/13
Anatomy

 Cranium:          Appears normal         Aortic Arch:      Not well visualized
 Fetal Cavum:      Appears normal         Ductal Arch:      Not well visualized
 Ventricles:       Appears normal         Diaphragm:        Appears normal
 Choroid Plexus:   Appears normal         Stomach:          Appears normal
 Cerebellum:       Appears normal         Abdomen:          Appears normal
 Posterior Fossa:  Appears normal         Abdominal Wall:   Appears nml (cord
                                                            insert, abd wall)
 Nuchal Fold:      Appears normal         Cord Vessels:     Appears normal (3
                                                            vessel cord)
 Face:             Appears normal         Kidneys:          Appear normal
                   (orbits and profile)
 Lips:             Not well visualized    Bladder:          Appears normal
 Heart:            Not well visualized    Spine:            Not well visualized
 RVOT:             Not well visualized    Lower             Appears normal
                                          Extremities:
 LVOT:             Not well visualized    Upper             Appears normal
                                          Extremities:

 Other:  Fetus appears to be a female. Heels and 5th digit visualized. Nasal
         bone visualized. Technically difficult due to early GA and fetal position.
Targeted Anatomy

 Fetal Central Nervous System
 Cisterna Magna:
Cervix Uterus Adnexa

 Cervical Length:    3.6      cm

 Cervix:       Normal appearance by transabdominal scan.
 Left Ovary:    Within normal limits.
 Right Ovary:   Within normal limits.
 Adnexa:     No abnormality visualized.
Impression

 IUP at 16+5 weeks
 Normal detailed fetal anatomy; due to early GA, heart, face
 and spine views not optimally visualized
 Markers of aneuploidy: none
 Normal amniotic fluid volume
 EDC based on today's measurements

 Her quad screen was recalculated to1 in 200 risk for T21. All
 of her prenatal testing and pregnancy management options
 were reviewed.  After careful consideration, she declined
 amniocentesis but opted to be screened using Panorama.

 We will forward the results to you as soon as they are
 available.
Recommendations

 Follow-up ultrasound in 4 weeks to complete anatomy survey
 Please see genetic counseling note

 questions or concerns.

## 2014-04-16 ENCOUNTER — Encounter (HOSPITAL_COMMUNITY): Payer: Self-pay | Admitting: Obstetrics & Gynecology

## 2014-05-07 ENCOUNTER — Ambulatory Visit: Payer: Self-pay | Admitting: Obstetrics

## 2014-05-16 ENCOUNTER — Ambulatory Visit: Payer: Self-pay | Admitting: Obstetrics

## 2014-05-21 ENCOUNTER — Ambulatory Visit: Payer: Self-pay | Admitting: Obstetrics

## 2014-05-28 ENCOUNTER — Encounter: Payer: Self-pay | Admitting: Obstetrics

## 2014-05-28 ENCOUNTER — Ambulatory Visit (INDEPENDENT_AMBULATORY_CARE_PROVIDER_SITE_OTHER): Payer: Medicaid Other | Admitting: Obstetrics

## 2014-05-28 VITALS — BP 125/72 | HR 76 | Temp 98.1°F | Ht 67.0 in | Wt 193.0 lb

## 2014-05-28 DIAGNOSIS — N760 Acute vaginitis: Secondary | ICD-10-CM

## 2014-05-28 DIAGNOSIS — N898 Other specified noninflammatory disorders of vagina: Secondary | ICD-10-CM

## 2014-05-28 DIAGNOSIS — B9689 Other specified bacterial agents as the cause of diseases classified elsewhere: Secondary | ICD-10-CM

## 2014-05-28 DIAGNOSIS — B373 Candidiasis of vulva and vagina: Secondary | ICD-10-CM

## 2014-05-28 DIAGNOSIS — A499 Bacterial infection, unspecified: Secondary | ICD-10-CM

## 2014-05-28 DIAGNOSIS — B3731 Acute candidiasis of vulva and vagina: Secondary | ICD-10-CM

## 2014-05-28 MED ORDER — FLUCONAZOLE 150 MG PO TABS: 150.0000 mg | ORAL_TABLET | Freq: Once | ORAL | Status: DC

## 2014-05-28 MED ORDER — METRONIDAZOLE 0.75 % VA GEL: 1.0000 | Freq: Two times a day (BID) | VAGINAL | Status: DC

## 2014-05-29 ENCOUNTER — Encounter: Payer: Self-pay | Admitting: Obstetrics

## 2014-05-29 NOTE — Progress Notes (Signed)
Patient ID: Brooke Lewis, female   DOB: Mar 15, 1976, 38 y.o.   MRN: [id number]  Chief Complaint  Patient presents with  . Problem    Irregular cycles     HPI Brooke BarretteDana Lewis is a 38 y.o. female.  Recurrent BV  HPI  Past Medical History  Diagnosis Date  . No pertinent past medical history   . Medical history non-contributory     Past Surgical History  Procedure Laterality Date  . No past surgeries    . Tubal ligation N/A 06/22/2013    Procedure: POST PARTUM TUBAL LIGATION;  Surgeon: Brooke Rosenthalarolyn Harraway-Smith, MD;  Location: [facility];  Service: Gynecology;  Laterality: N/A;    Family History  Problem Relation Age of Onset  . Diabetes Mother   . Hypertension Mother   . Diabetes Maternal Grandmother   . Cancer Neg Hx     Social History History  Substance Use Topics  . Smoking status: Never Smoker   . Smokeless tobacco: Never Used  . Alcohol Use: No    No Known Allergies  Current Outpatient Prescriptions  Medication Sig Dispense Refill  . fluconazole (DIFLUCAN) 150 MG tablet Take 1 tablet (150 mg total) by mouth once. 1 tablet 2  . metroNIDAZOLE (METROGEL VAGINAL) 0.75 % vaginal gel Place 1 Applicatorful vaginally 2 (two) times daily. 70 g 5   No current facility-administered medications for this visit.    Review of Systems Review of Systems Constitutional: negative for fatigue and weight loss Respiratory: negative for cough and wheezing Cardiovascular: negative for chest pain, fatigue and palpitations Gastrointestinal: negative for abdominal pain and change in bowel habits Genitourinary: Malodorous vaginal discharge after period Integument/breast: negative for nipple discharge Musculoskeletal:negative for myalgias Neurological: negative for gait problems and tremors Behavioral/Psych: negative for abusive relationship, depression Endocrine: negative for temperature intolerance     Blood pressure 125/72, pulse 76, temperature 98.1 F (36.7 C), height 5\' 7"  (1.702 m), weight  193 lb (87.544 kg), last menstrual period 05/07/2014, not currently breastfeeding.  Physical Exam Physical Exam General:   alert  Skin:   no rash or abnormalities  Lungs:   clear to auscultation bilaterally  Heart:   regular rate and rhythm, S1, S2 normal, no murmur, click, rub or gallop  Breasts:   normal without suspicious masses, skin or nipple changes or axillary nodes  Abdomen:  normal findings: no organomegaly, soft, non-tender and no hernia  Pelvis:  External genitalia: normal general appearance Urinary system: urethral meatus normal and bladder without fullness, nontender Vaginal: normal without tenderness, induration or masses Cervix: normal appearance Adnexa: normal bimanual exam Uterus: anteverted and non-tender, normal size      Data Reviewed Labs  Assessment    Recurrent BV.     Plan    Flagyl Rx, chronic therapy. F/U 4 months.  Orders Placed This Encounter  Procedures  . SureSwab, Vaginosis/Vaginitis Plus   Meds ordered this encounter  Medications  . metroNIDAZOLE (METROGEL VAGINAL) 0.75 % vaginal gel    Sig: Place 1 Applicatorful vaginally 2 (two) times daily.    Dispense:  70 g    Refill:  5  . fluconazole (DIFLUCAN) 150 MG tablet    Sig: Take 1 tablet (150 mg total) by mouth once.    Dispense:  1 tablet    Refill:  2      Pastor Sgro A 05/29/2014, 7:41 AM

## 2014-05-31 ENCOUNTER — Other Ambulatory Visit: Payer: Self-pay | Admitting: Obstetrics

## 2014-05-31 LAB — SURESWAB, VAGINOSIS/VAGINITIS PLUS
ATOPOBIUM VAGINAE: 5.7 Log (cells/mL)
BV CATEGORY: UNDETERMINED — AB
C. albicans, DNA: NOT DETECTED
C. glabrata, DNA: NOT DETECTED
C. parapsilosis, DNA: NOT DETECTED
C. trachomatis RNA, TMA: NOT DETECTED
C. tropicalis, DNA: NOT DETECTED
GARDNERELLA VAGINALIS: 8 Log (cells/mL)
LACTOBACILLUS SPECIES: 5.1 Log (cells/mL)
MEGASPHAERA SPECIES: 5.8 Log (cells/mL)
N. gonorrhoeae RNA, TMA: NOT DETECTED
T. vaginalis RNA, QL TMA: NOT DETECTED

## 2014-10-02 ENCOUNTER — Ambulatory Visit: Payer: Medicaid Other | Admitting: Obstetrics

## 2014-10-03 ENCOUNTER — Ambulatory Visit (INDEPENDENT_AMBULATORY_CARE_PROVIDER_SITE_OTHER): Payer: Medicaid Other | Admitting: Obstetrics

## 2014-10-03 ENCOUNTER — Encounter: Payer: Self-pay | Admitting: Obstetrics

## 2014-10-03 VITALS — BP 110/70 | HR 73 | Temp 98.1°F | Wt 190.0 lb

## 2014-10-03 DIAGNOSIS — B3731 Acute candidiasis of vulva and vagina: Secondary | ICD-10-CM

## 2014-10-03 DIAGNOSIS — B373 Candidiasis of vulva and vagina: Secondary | ICD-10-CM

## 2014-10-03 DIAGNOSIS — B9689 Other specified bacterial agents as the cause of diseases classified elsewhere: Secondary | ICD-10-CM

## 2014-10-03 DIAGNOSIS — A499 Bacterial infection, unspecified: Secondary | ICD-10-CM

## 2014-10-03 DIAGNOSIS — N760 Acute vaginitis: Secondary | ICD-10-CM | POA: Diagnosis not present

## 2014-10-03 MED ORDER — FLUCONAZOLE 150 MG PO TABS: 150.0000 mg | ORAL_TABLET | Freq: Once | ORAL | Status: DC

## 2014-10-03 MED ORDER — METRONIDAZOLE 0.75 % VA GEL: 1.0000 | Freq: Two times a day (BID) | VAGINAL | Status: DC

## 2014-10-03 NOTE — Progress Notes (Signed)
Patient ID: Brooke Lewis, female   DOB: 04-30-1976, 39 y.o.   MRN: [id number]  Chief Complaint  Patient presents with  . Follow-up    chronic BV    HPI Brooke Lewis is a 39 y.o. female.  F/U after chronic BV treatment.    HPI  Past Medical History  Diagnosis Date  . No pertinent past medical history   . Medical history non-contributory     Past Surgical History  Procedure Laterality Date  . No past surgeries    . Tubal ligation N/A 06/22/2013    Procedure: POST PARTUM TUBAL LIGATION;  Surgeon: Willodean Rosenthalarolyn Harraway-Smith, MD;  Location: [facility];  Service: Gynecology;  Laterality: N/A;    Family History  Problem Relation Age of Onset  . Diabetes Mother   . Hypertension Mother   . Diabetes Maternal Grandmother   . Cancer Neg Hx     Social History History  Substance Use Topics  . Smoking status: Never Smoker   . Smokeless tobacco: Never Used  . Alcohol Use: No    No Known Allergies  Current Outpatient Prescriptions  Medication Sig Dispense Refill  . fluconazole (DIFLUCAN) 150 MG tablet Take 1 tablet (150 mg total) by mouth once. 1 tablet 5  . metroNIDAZOLE (METROGEL VAGINAL) 0.75 % vaginal gel Place 1 Applicatorful vaginally 2 (two) times daily. 70 g 5   No current facility-administered medications for this visit.    Review of Systems Review of Systems Constitutional: negative for fatigue and weight loss Respiratory: negative for cough and wheezing Cardiovascular: negative for chest pain, fatigue and palpitations Gastrointestinal: negative for abdominal pain and change in bowel habits Genitourinary: Chronic BV Integument/breast: negative for nipple discharge Musculoskeletal:negative for myalgias Neurological: negative for gait problems and tremors Behavioral/Psych: negative for abusive relationship, depression Endocrine: negative for temperature intolerance     Blood pressure 110/70, pulse 73, temperature 98.1 F (36.7 C), weight 190 lb (86.183 kg), last menstrual  period 09/28/2014, not currently breastfeeding.  Physical Exam Physical Exam:  100% of 10 min visit spent on counseling and coordination of care.   Data Reviewed Labs  Assessment     BV.  Chronic.  Responded well to chonic treatment.    Plan    Continue chronic, monthly MetroGel / Diflucan for 6 months. F/U in 6 months.   No orders of the defined types were placed in this encounter.   Meds ordered this encounter  Medications  . fluconazole (DIFLUCAN) 150 MG tablet    Sig: Take 1 tablet (150 mg total) by mouth once.    Dispense:  1 tablet    Refill:  5  . metroNIDAZOLE (METROGEL VAGINAL) 0.75 % vaginal gel    Sig: Place 1 Applicatorful vaginally 2 (two) times daily.    Dispense:  70 g    Refill:  5

## 2015-04-04 ENCOUNTER — Ambulatory Visit (INDEPENDENT_AMBULATORY_CARE_PROVIDER_SITE_OTHER): Payer: Medicaid Other | Admitting: Obstetrics

## 2015-04-04 ENCOUNTER — Encounter: Payer: Self-pay | Admitting: Obstetrics

## 2015-04-04 VITALS — BP 112/71 | HR 73 | Temp 97.7°F | Wt 183.0 lb

## 2015-04-04 DIAGNOSIS — A499 Bacterial infection, unspecified: Secondary | ICD-10-CM | POA: Diagnosis not present

## 2015-04-04 DIAGNOSIS — B9689 Other specified bacterial agents as the cause of diseases classified elsewhere: Secondary | ICD-10-CM

## 2015-04-04 DIAGNOSIS — N760 Acute vaginitis: Secondary | ICD-10-CM | POA: Diagnosis not present

## 2015-04-04 NOTE — Progress Notes (Signed)
   Subjective:        Brooke Lewis is a 39 y.o. female here for for F/U.  Current complaints: Chronic BV.  Has been on MetroGel since April 201   Gynecologic History Patient's last menstrual period was 03/25/2015 (exact date). Contraception: tubal ligation Last Pap: 2015. Results were: normal Last mammogram: n/a. Results were: n/a  Obstetric History OB History  Gravida Para Term Preterm AB SAB TAB Ectopic Multiple Living  4 4 4       4     # Outcome Date GA Lbr Len/2nd Weight Sex Delivery Anes PTL Lv  4 Term 06/22/13 1497w0d 14:26 / 00:23 6 lb 6 oz (2.892 kg) F Vag-Spont None  Y  3 Term 09/04/11 676w0d 02:26 / 00:07 7 lb 7 oz (3.374 kg) M Vag-Spont None  Y  2 Term 2011    M Vag-Spont None  Y  1 Term 2006    M Vag-Spont None  Y      Past Medical History  Diagnosis Date  . No pertinent past medical history   . Medical history non-contributory     Past Surgical History  Procedure Laterality Date  . No past surgeries    . Tubal ligation N/A 06/22/2013    Procedure: POST PARTUM TUBAL LIGATION;  Surgeon: Willodean Rosenthalarolyn Harraway-Smith, MD;  Location: [facility];  Service: Gynecology;  Laterality: N/A;     Current outpatient prescriptions:  .  fluconazole (DIFLUCAN) 150 MG tablet, Take 1 tablet (150 mg total) by mouth once., Disp: 1 tablet, Rfl: 5 .  metroNIDAZOLE (METROGEL VAGINAL) 0.75 % vaginal gel, Place 1 Applicatorful vaginally 2 (two) times daily., Disp: 70 g, Rfl: 5 No Known Allergies  Social History  Substance Use Topics  . Smoking status: Never Smoker   . Smokeless tobacco: Never Used  . Alcohol Use: No    Family History  Problem Relation Age of Onset  . Diabetes Mother   . Hypertension Mother   . Diabetes Maternal Grandmother   . Cancer Neg Hx       Review of Systems  Constitutional: negative for fatigue and weight loss Respiratory: negative for cough and wheezing Cardiovascular: negative for chest pain, fatigue and palpitations Gastrointestinal: negative for abdominal  pain and change in bowel habits Musculoskeletal:negative for myalgias Neurological: negative for gait problems and tremors Behavioral/Psych: negative for abusive relationship, depression Endocrine: negative for temperature intolerance   Genitourinary:negative for abnormal menstrual periods, genital lesions, hot flashes, sexual problems.  Positive for vaginal discharge Integument/breast: negative for breast lump, breast tenderness, nipple discharge and skin lesion(s)    Objective:       BP 112/71 mmHg  Pulse 73  Temp(Src) 97.7 F (36.5 C)  Wt 183 lb (83.008 kg)  LMP 03/25/2015 (Exact Date)  Breastfeeding? No   PE:  Deferred  Lab Review Urine pregnancy test Labs reviewed yes Radiologic studies reviewed no  100% of 10 min visit spent on counseling and coordination of care.   Assessment:    Chronic BV.  On treatment with MetroGel monthly for th past 6 months.   Plan:    Education reviewed: safe sex/STD prevention. Follow up in: 6 weeks.  Wet prep needed off MetroGel.  No orders of the defined types were placed in this encounter.   No orders of the defined types were placed in this encounter.

## 2015-05-16 ENCOUNTER — Ambulatory Visit: Payer: Medicaid Other | Admitting: Obstetrics

## 2015-05-30 ENCOUNTER — Encounter: Payer: Self-pay | Admitting: Obstetrics

## 2015-05-30 ENCOUNTER — Ambulatory Visit (INDEPENDENT_AMBULATORY_CARE_PROVIDER_SITE_OTHER): Payer: Medicaid Other | Admitting: Obstetrics

## 2015-05-30 VITALS — BP 118/75 | HR 69 | Wt 184.0 lb

## 2015-05-30 DIAGNOSIS — Z0000 Encounter for general adult medical examination without abnormal findings: Secondary | ICD-10-CM

## 2015-05-30 DIAGNOSIS — Z01419 Encounter for gynecological examination (general) (routine) without abnormal findings: Secondary | ICD-10-CM

## 2015-05-30 DIAGNOSIS — B9689 Other specified bacterial agents as the cause of diseases classified elsewhere: Secondary | ICD-10-CM

## 2015-05-30 DIAGNOSIS — N760 Acute vaginitis: Secondary | ICD-10-CM

## 2015-05-30 DIAGNOSIS — N946 Dysmenorrhea, unspecified: Secondary | ICD-10-CM

## 2015-05-30 MED ORDER — METRONIDAZOLE 0.75 % VA GEL: 1.0000 | Freq: Two times a day (BID) | VAGINAL | Status: DC

## 2015-05-30 MED ORDER — IBUPROFEN 800 MG PO TABS: 800.0000 mg | ORAL_TABLET | Freq: Three times a day (TID) | ORAL | Status: DC | PRN

## 2015-05-30 NOTE — Progress Notes (Signed)
Subjective:        Brooke Lewis is a 39 y.o. female here for a routine exam.  Current complaints: Recurrent BV after periods..    Personal health questionnaire:  Is patient Ashkenazi Jewish, have a family history of breast and/or ovarian cancer: no Is there a family history of uterine cancer diagnosed at age < 43, gastrointestinal cancer, urinary tract cancer, family member who is a Personnel officer syndrome-associated carrier: no Is the patient overweight and hypertensive, family history of diabetes, personal history of gestational diabetes, preeclampsia or PCOS: no Is patient over 29, have PCOS,  family history of premature CHD under age 64, diabetes, smoke, have hypertension or peripheral artery disease:  no At any time, has a partner hit, kicked or otherwise hurt or frightened you?: no Over the past 2 weeks, have you felt down, depressed or hopeless?: no Over the past 2 weeks, have you felt little interest or pleasure in doing things?:no   Gynecologic History Patient's last menstrual period was 05/16/2015. Contraception: tubal ligation Last Pap: Unknown. Results were: normal Last mammogram: n/a. Results were: n/a  Obstetric History OB History  Gravida Para Term Preterm AB SAB TAB Ectopic Multiple Living  # Outcome Date GA Lbr Len/2nd Weight Sex Delivery Anes PTL Lv  4 Term 06/22/13 [redacted]w[redacted]d 14:26 / 00:23 6 lb 6 oz (2.892 kg) F Vag-Spont None  Y  3 Term 09/04/11 [redacted]w[redacted]d 02:26 / 00:07 7 lb 7 oz (3.374 kg) M Vag-Spont None  Y  2 Term 2011    M Vag-Spont None  Y  1 Term 2006    M Vag-Spont None  Y      Past Medical History  Diagnosis Date  . No pertinent past medical history   . Medical history non-contributory     Past Surgical History  Procedure Laterality Date  . No past surgeries    . Tubal ligation N/A 06/22/2013    Procedure: POST PARTUM TUBAL LIGATION;  Surgeon: Willodean Rosenthal, MD;  Location: [facility];  Service: Gynecology;  Laterality: N/A;     Current  outpatient prescriptions:  .  ibuprofen (ADVIL,MOTRIN) 800 MG tablet, Take 1 tablet (800 mg total) by mouth every 8 (eight) hours as needed., Disp: 30 tablet, Rfl: 5 .  metroNIDAZOLE (METROGEL VAGINAL) 0.75 % vaginal gel, Place 1 Applicatorful vaginally 2 (two) times daily., Disp: 70 g, Rfl: 5 No Known Allergies  Social History  Substance Use Topics  . Smoking status: Never Smoker   . Smokeless tobacco: Never Used  . Alcohol Use: No    Family History  Problem Relation Age of Onset  . Diabetes Mother   . Hypertension Mother   . Diabetes Maternal Grandmother   . Cancer Neg Hx       Review of Systems  Constitutional: negative for fatigue and weight loss Respiratory: negative for cough and wheezing Cardiovascular: negative for chest pain, fatigue and palpitations Gastrointestinal: negative for abdominal pain and change in bowel habits Musculoskeletal:negative for myalgias Neurological: negative for gait problems and tremors Behavioral/Psych: negative for abusive relationship, depression Endocrine: negative for temperature intolerance   Genitourinary:negative for abnormal menstrual periods, genital lesions, hot flashes, sexual problems and vaginal discharge Integument/breast: negative for breast lump, breast tenderness, nipple discharge and skin lesion(s)    Objective:       BP 118/75 mmHg  Pulse 69  Wt 184 lb (83.462 kg)  LMP 05/16/2015 General:   alert  Skin:   no rash or abnormalities  Lungs:   clear to auscultation bilaterally  Heart:   regular rate and rhythm, S1, S2 normal, no murmur, click, rub or gallop  Breasts:   normal without suspicious masses, skin or nipple changes or axillary nodes  Abdomen:  normal findings: no organomegaly, soft, non-tender and no hernia  Pelvis:  External genitalia: normal general appearance Urinary system: urethral meatus normal and bladder without fullness, nontender Vaginal: normal without tenderness, induration or masses Cervix:  normal appearance Adnexa: normal bimanual exam Uterus: anteverted and non-tender, normal size   Lab Review Urine pregnancy test Labs reviewed yes Radiologic studies reviewed no    Assessment:    Healthy female exam.    BV   Plan:   MetroGel Rx   Education reviewed: calcium supplements, low fat, low cholesterol diet, safe sex/STD prevention, self breast exams and weight bearing exercise.   Meds ordered this encounter  Medications  . metroNIDAZOLE (METROGEL VAGINAL) 0.75 % vaginal gel    Sig: Place 1 Applicatorful vaginally 2 (two) times daily.    Dispense:  70 g    Refill:  5  . ibuprofen (ADVIL,MOTRIN) 800 MG tablet    Sig: Take 1 tablet (800 mg total) by mouth every 8 (eight) hours as needed.    Dispense:  30 tablet    Refill:  5   Orders Placed This Encounter  Procedures  . SureSwab, Vaginosis/Vaginitis Plus

## 2015-06-03 LAB — SURESWAB, VAGINOSIS/VAGINITIS PLUS
Atopobium vaginae: 6 Log (cells/mL)
C. TROPICALIS, DNA: NOT DETECTED
C. albicans, DNA: NOT DETECTED
C. glabrata, DNA: NOT DETECTED
C. parapsilosis, DNA: NOT DETECTED
C. trachomatis RNA, TMA: NOT DETECTED
Gardnerella vaginalis: >8 Log (cells/mL)
LACTOBACILLUS SPECIES: NOT DETECTED Log (cells/mL)
MEGASPHAERA SPECIES: 8 Log (cells/mL)
N. GONORRHOEAE RNA, TMA: NOT DETECTED
T. vaginalis RNA, QL TMA: NOT DETECTED

## 2015-06-03 LAB — PAP, TP IMAGING W/ HPV RNA, RFLX HPV TYPE 16,18/45: HPV MRNA, HIGH RISK: NOT DETECTED

## 2015-06-04 ENCOUNTER — Other Ambulatory Visit: Payer: Self-pay | Admitting: Obstetrics

## 2015-06-30 ENCOUNTER — Emergency Department (HOSPITAL_COMMUNITY)
Admission: EM | Admit: 2015-06-30 | Discharge: 2015-06-30 | Disposition: A | Discharge status: Home / Self Care | Payer: Medicaid Other | Attending: Emergency Medicine | Admitting: Emergency Medicine

## 2015-06-30 ENCOUNTER — Encounter (HOSPITAL_COMMUNITY): Payer: Self-pay | Admitting: Emergency Medicine

## 2015-06-30 DIAGNOSIS — N760 Acute vaginitis: Secondary | ICD-10-CM | POA: Diagnosis not present

## 2015-06-30 DIAGNOSIS — N898 Other specified noninflammatory disorders of vagina: Secondary | ICD-10-CM

## 2015-06-30 DIAGNOSIS — B9689 Other specified bacterial agents as the cause of diseases classified elsewhere: Secondary | ICD-10-CM

## 2015-06-30 DIAGNOSIS — Z3202 Encounter for pregnancy test, result negative: Secondary | ICD-10-CM | POA: Insufficient documentation

## 2015-06-30 DIAGNOSIS — Z9851 Tubal ligation status: Secondary | ICD-10-CM | POA: Insufficient documentation

## 2015-06-30 DIAGNOSIS — R1030 Lower abdominal pain, unspecified: Secondary | ICD-10-CM | POA: Diagnosis present

## 2015-06-30 LAB — CBC
HEMATOCRIT: 31.3 % — AB (ref 36.0–46.0)
HEMOGLOBIN: 9.9 g/dL — AB (ref 12.0–15.0)
MCH: 23.3 pg — ABNORMAL LOW (ref 26.0–34.0)
MCHC: 31.6 g/dL (ref 30.0–36.0)
MCV: 73.6 fL — ABNORMAL LOW (ref 78.0–100.0)
Platelets: 336 10*3/uL (ref 150–400)
RBC: 4.25 MIL/uL (ref 3.87–5.11)
RDW: 17.1 % — ABNORMAL HIGH (ref 11.5–15.5)
WBC: 3.9 10*3/uL — ABNORMAL LOW (ref 4.0–10.5)

## 2015-06-30 LAB — COMPREHENSIVE METABOLIC PANEL
ALK PHOS: 45 U/L (ref 38–126)
ALT: 20 U/L (ref 14–54)
AST: 23 U/L (ref 15–41)
Albumin: 4 g/dL (ref 3.5–5.0)
Anion gap: 8 (ref 5–15)
BILIRUBIN TOTAL: 0.7 mg/dL (ref 0.3–1.2)
BUN: 10 mg/dL (ref 6–20)
CO2: 27 mmol/L (ref 22–32)
Calcium: 9 mg/dL (ref 8.9–10.3)
Chloride: 104 mmol/L (ref 101–111)
Creatinine, Ser: 0.71 mg/dL (ref 0.44–1.00)
GFR calc non Af Amer: >60 mL/min (ref >60)
GLUCOSE: 82 mg/dL (ref 65–99)
POTASSIUM: 3.7 mmol/L (ref 3.5–5.1)
SODIUM: 139 mmol/L (ref 135–145)
TOTAL PROTEIN: 7 g/dL (ref 6.5–8.1)

## 2015-06-30 LAB — URINALYSIS, ROUTINE W REFLEX MICROSCOPIC
Bilirubin Urine: NEGATIVE
Glucose, UA: NEGATIVE mg/dL
Hgb urine dipstick: NEGATIVE
Ketones, ur: NEGATIVE mg/dL
Nitrite: NEGATIVE
PH: 6 (ref 5.0–8.0)
Protein, ur: NEGATIVE mg/dL
SPECIFIC GRAVITY, URINE: 1.018 (ref 1.005–1.030)

## 2015-06-30 LAB — URINE MICROSCOPIC-ADD ON

## 2015-06-30 LAB — PREGNANCY, URINE: PREG TEST UR: NEGATIVE

## 2015-06-30 LAB — WET PREP, GENITAL
Sperm: NONE SEEN
Trich, Wet Prep: NONE SEEN

## 2015-06-30 LAB — LIPASE, BLOOD: Lipase: 23 U/L (ref 11–51)

## 2015-06-30 MED ORDER — AZITHROMYCIN 250 MG PO TABS
1000.0000 mg | ORAL_TABLET | Freq: Once | ORAL | Status: AC
  Administered 2015-06-30: 1000 mg via ORAL
  Filled 2015-06-30: qty 4

## 2015-06-30 MED ORDER — CEFTRIAXONE SODIUM 250 MG IJ SOLR
250.0000 mg | Freq: Once | INTRAMUSCULAR | Status: AC
  Administered 2015-06-30: 250 mg via INTRAMUSCULAR
  Filled 2015-06-30: qty 250

## 2015-06-30 MED ORDER — METRONIDAZOLE 500 MG PO TABS: 500.0000 mg | ORAL_TABLET | Freq: Two times a day (BID) | ORAL | Status: DC

## 2015-06-30 NOTE — ED Provider Notes (Signed)
CSN: [id number]     Arrival date & time 06/30/15  1812 History   First MD Initiated Contact with Patient 06/30/15 2125     Chief Complaint  Patient presents with  . Abdominal Pain     (Consider location/radiation/quality/duration/timing/severity/associated sxs/prior Treatment) HPI Comments: Patient presents emergency department with chief complaint of lower abdominal cramping 1 week. She has had a recent chlamydia exposure. She denies any vaginal discharge. Denies any nausea, vomiting, diarrhea, or dysuria. She states that her last period was last week. She has not tried taking anything for her symptoms. There are no aggravating or alleviating factors.  The history is provided by the patient. No language interpreter was used.    Past Medical History  Diagnosis Date  . No pertinent past medical history   . Medical history non-contributory    Past Surgical History  Procedure Laterality Date  . No past surgeries    . Tubal ligation N/A 06/22/2013    Procedure: POST PARTUM TUBAL LIGATION;  Surgeon: Willodean Rosenthal, MD;  Location: [facility];  Service: Gynecology;  Laterality: N/A;   Family History  Problem Relation Age of Onset  . Diabetes Mother   . Hypertension Mother   . Diabetes Maternal Grandmother   . Cancer Neg Hx    Social History  Substance Use Topics  . Smoking status: Never Smoker   . Smokeless tobacco: Never Used  . Alcohol Use: No   OB History    Gravida Para Term Preterm AB TAB SAB Ectopic Multiple Living   4 4 4       4      Review of Systems  Constitutional: Negative for fever and chills.  Respiratory: Negative for shortness of breath.   Cardiovascular: Negative for chest pain.  Gastrointestinal: Positive for abdominal pain. Negative for nausea, vomiting, diarrhea and constipation.  Genitourinary: Negative for dysuria.  All other systems reviewed and are negative.     Allergies  Review of patient's allergies indicates no known allergies.  Home  Medications   Prior to Admission medications   Medication Sig Start Date End Date Taking? Authorizing Provider  ibuprofen (ADVIL,MOTRIN) 800 MG tablet Take 1 tablet (800 mg total) by mouth every 8 (eight) hours as needed. Patient not taking: Reported on 06/30/2015 05/30/15   Brock Bad, MD  metroNIDAZOLE (METROGEL VAGINAL) 0.75 % vaginal gel Place 1 Applicatorful vaginally 2 (two) times daily. Patient not taking: Reported on 06/30/2015 05/30/15   Brock Bad, MD   BP 124/63 mmHg  Pulse 84  Temp(Src) 98.7 F (37.1 C) (Oral)  Resp 18  SpO2 100%  LMP 06/16/2015 (Exact Date) Physical Exam  Constitutional: She is oriented to person, place, and time. She appears well-developed and well-nourished.  HENT:  Head: Normocephalic and atraumatic.  Eyes: Conjunctivae and EOM are normal. Pupils are equal, round, and reactive to light.  Neck: Normal range of motion. Neck supple.  Cardiovascular: Normal rate and regular rhythm.  Exam reveals no gallop and no friction rub.   No murmur heard. Pulmonary/Chest: Effort normal and breath sounds normal. No respiratory distress. She has no wheezes. She has no rales. She exhibits no tenderness.  Abdominal: Soft. Bowel sounds are normal. She exhibits no distension and no mass. There is no tenderness. There is no rebound and no guarding.  No focal abdominal tenderness, no RLQ tenderness or pain at McBurney's point, no RUQ tenderness or Murphy's sign, no left-sided abdominal tenderness, no fluid wave, or signs of peritonitis   Genitourinary:  Pelvic  exam chaperoned by female ER tech, no right or left adnexal tenderness, no uterine tenderness, mild white vaginal discharge, no bleeding, no CMT or friability, no foreign body, no injury to the external genitalia, no other significant findings   Musculoskeletal: Normal range of motion. She exhibits no edema or tenderness.  Neurological: She is alert and oriented to person, place, and time.  Skin: Skin is  warm and dry.  Psychiatric: She has a normal mood and affect. Her behavior is normal. Judgment and thought content normal.  Nursing note and vitals reviewed.   ED Course  Procedures (including critical care time) Results for orders placed or performed during the hospital encounter of 06/30/15  Wet prep, genital  Result Value Ref Range   Yeast Wet Prep HPF POC RARE (A) NONE SEEN   Trich, Wet Prep NONE SEEN NONE SEEN   Clue Cells Wet Prep HPF POC FEW (A) NONE SEEN   WBC, Wet Prep HPF POC RARE (A) NONE SEEN   Sperm NONE SEEN   Lipase, blood  Result Value Ref Range   Lipase 23 11 - 51 U/L  Comprehensive metabolic panel  Result Value Ref Range   Sodium 139 135 - 145 mmol/L   Potassium 3.7 3.5 - 5.1 mmol/L   Chloride 104 101 - 111 mmol/L   CO2 27 22 - 32 mmol/L   Glucose, Bld 82 65 - 99 mg/dL   BUN 10 6 - 20 mg/dL   Creatinine, Ser 1.61 0.44 - 1.00 mg/dL   Calcium 9.0 8.9 - 09.6 mg/dL   Total Protein 7.0 6.5 - 8.1 g/dL   Albumin 4.0 3.5 - 5.0 g/dL   AST 23 15 - 41 U/L   ALT 20 14 - 54 U/L   Alkaline Phosphatase 45 38 - 126 U/L   Total Bilirubin 0.7 0.3 - 1.2 mg/dL   GFR calc non Af Amer >60 >60 mL/min   GFR calc Af Amer >60 >60 mL/min   Anion gap 8 5 - 15  CBC  Result Value Ref Range   WBC 3.9 (L) 4.0 - 10.5 K/uL   RBC 4.25 3.87 - 5.11 MIL/uL   Hemoglobin 9.9 (L) 12.0 - 15.0 g/dL   HCT 04.5 (L) 40.9 - 81.1 %   MCV 73.6 (L) 78.0 - 100.0 fL   MCH 23.3 (L) 26.0 - 34.0 pg   MCHC 31.6 30.0 - 36.0 g/dL   RDW 91.4 (H) 78.2 - 95.6 %   Platelets 336 150 - 400 K/uL  Urinalysis, Routine w reflex microscopic (not at [facility])  Result Value Ref Range   Color, Urine YELLOW YELLOW   APPearance CLOUDY (A) CLEAR   Specific Gravity, Urine 1.018 1.005 - 1.030   pH 6.0 5.0 - 8.0   Glucose, UA NEGATIVE NEGATIVE mg/dL   Hgb urine dipstick NEGATIVE NEGATIVE   Bilirubin Urine NEGATIVE NEGATIVE   Ketones, ur NEGATIVE NEGATIVE mg/dL   Protein, ur NEGATIVE NEGATIVE mg/dL   Nitrite NEGATIVE  NEGATIVE   Leukocytes, UA SMALL (A) NEGATIVE  Urine microscopic-add on  Result Value Ref Range   Squamous Epithelial / LPF 6-30 (A) NONE SEEN   WBC, UA 0-5 0 - 5 WBC/hpf   RBC / HPF 0-5 0 - 5 RBC/hpf   Bacteria, UA MANY (A) NONE SEEN  Pregnancy, urine  Result Value Ref Range   Preg Test, Ur NEGATIVE NEGATIVE   No results found.  I have personally reviewed and evaluated these images and lab results as part of my  medical decision-making.    MDM   Final diagnoses:  Vaginal discharge  BV (bacterial vaginosis)    Patient with lower abdominal cramping 1 week. She denies vaginal discharge. She does report recent exposure to chlamydia. Pelvic exam is remarkable for some white discharge. Pregnancy test is negative. Wet prep remarkable for some clue cells. Will treat with Flagyl, will also give Rocephin and azithromycin for suspected chlamydia exposure. Recommend primary care follow-up.  Roxy Horsemanobert Savvy Peeters, PA-C 06/30/15 2314  Rolland PorterMark James, MD 07/03/15 [phone]

## 2015-06-30 NOTE — ED Notes (Signed)
Pt c/o low abd cramping x 1 wk.  Denies any NVD, dysuria, vaginal discharge.  States she finished her period last week.

## 2015-06-30 NOTE — Discharge Instructions (Signed)
Bacterial Vaginosis °Bacterial vaginosis is a vaginal infection that occurs when the normal balance of bacteria in the vagina is disrupted. It results from an overgrowth of certain bacteria. This is the most common vaginal infection in women of childbearing age. Treatment is important to prevent complications, especially in pregnant women, as it can cause a premature delivery. °CAUSES  °Bacterial vaginosis is caused by an increase in harmful bacteria that are normally present in smaller amounts in the vagina. Several different kinds of bacteria can cause bacterial vaginosis. However, the reason that the condition develops is not fully understood. °RISK FACTORS °Certain activities or behaviors can put you at an increased risk of developing bacterial vaginosis, including: °· Having a new sex partner or multiple sex partners. °· Douching. °· Using an intrauterine device (IUD) for contraception. °Women do not get bacterial vaginosis from toilet seats, bedding, swimming pools, or contact with objects around them. °SIGNS AND SYMPTOMS  °Some women with bacterial vaginosis have no signs or symptoms. Common symptoms include: °· Grey vaginal discharge. °· A fishlike odor with discharge, especially after sexual intercourse. °· Itching or burning of the vagina and vulva. °· Burning or pain with urination. °DIAGNOSIS  °Your health care provider will take a medical history and examine the vagina for signs of bacterial vaginosis. A sample of vaginal fluid may be taken. Your health care provider will look at this sample under a microscope to check for bacteria and abnormal cells. A vaginal pH test may also be done.  °TREATMENT  °Bacterial vaginosis may be treated with antibiotic medicines. These may be given in the form of a pill or a vaginal cream. A second round of antibiotics may be prescribed if the condition comes back after treatment. Because bacterial vaginosis increases your risk for sexually transmitted diseases, getting  treated can help reduce your risk for chlamydia, gonorrhea, HIV, and herpes. °HOME CARE INSTRUCTIONS  °· Only take over-the-counter or prescription medicines as directed by your health care provider. °· If antibiotic medicine was prescribed, take it as directed. Make sure you finish it even if you start to feel better. °· Tell all sexual partners that you have a vaginal infection. They should see their health care provider and be treated if they have problems, such as a mild rash or itching. °· During treatment, it is important that you follow these instructions: °· Avoid sexual activity or use condoms correctly. °· Do not douche. °· Avoid alcohol as directed by your health care provider. °· Avoid breastfeeding as directed by your health care provider. °SEEK MEDICAL CARE IF:  °· Your symptoms are not improving after 3 days of treatment. °· You have increased discharge or pain. °· You have a fever. °MAKE SURE YOU:  °· Understand these instructions. °· Will watch your condition. °· Will get help right away if you are not doing well or get worse. °FOR MORE INFORMATION  °Centers for Disease Control and Prevention, Division of STD Prevention: www.cdc.gov/std °American Sexual Health Association (ASHA): www.ashastd.org  °  °This information is not intended to replace advice given to you by your health care provider. Make sure you discuss any questions you have with your health care provider. °  °Document Released: 06/01/2005 Document Revised: 06/22/2014 Document Reviewed: 01/11/2013 °Elsevier Interactive Patient Education ©2016 [employer] ° °Sexually Transmitted Disease °A sexually transmitted disease (STD) is a disease or infection that may be passed (transmitted) from person to person, usually during sexual activity. This may happen by way of saliva, semen, blood, vaginal   mucus, or urine. Common STDs include: °· Gonorrhea. °· Chlamydia. °· Syphilis. °· HIV and AIDS. °· Genital herpes. °· Hepatitis B and  C. °· Trichomonas. °· Human papillomavirus (HPV). °· Pubic lice. °· Scabies. °· Mites. °· Bacterial vaginosis. °WHAT ARE CAUSES OF STDs? °An STD may be caused by bacteria, a virus, or parasites. STDs are often transmitted during sexual activity if one person is infected. However, they may also be transmitted through nonsexual means. STDs may be transmitted after:  °· Sexual intercourse with an infected person. °· Sharing sex toys with an infected person. °· Sharing needles with an infected person or using unclean piercing or tattoo needles. °· Having intimate contact with the genitals, mouth, or rectal areas of an infected person. °· Exposure to infected fluids during birth. °WHAT ARE THE SIGNS AND SYMPTOMS OF STDs? °Different STDs have different symptoms. Some people may not have any symptoms. If symptoms are present, they may include: °· Painful or bloody urination. °· Pain in the pelvis, abdomen, vagina, anus, throat, or eyes. °· A skin rash, itching, or irritation. °· Growths, ulcerations, blisters, or sores in the genital and anal areas. °· Abnormal vaginal discharge with or without bad odor. °· Penile discharge in men. °· Fever. °· Pain or bleeding during sexual intercourse. °· Swollen glands in the groin area. °· Yellow skin and eyes (jaundice). This is seen with hepatitis. °· Swollen testicles. °· Infertility. °· Sores and blisters in the mouth. °HOW ARE STDs DIAGNOSED? °To make a diagnosis, your health care provider may: °· Take a medical history. °· Perform a physical exam. °· Take a sample of any discharge to examine. °· Swab the throat, cervix, opening to the penis, rectum, or vagina for testing. °· Test a sample of your first morning urine. °· Perform blood tests. °· Perform a Pap test, if this applies. °· Perform a colposcopy. °· Perform a laparoscopy. °HOW ARE STDs TREATED? °Treatment depends on the STD. Some STDs may be treated but not cured. °· Chlamydia, gonorrhea, trichomonas, and syphilis can be  cured with antibiotic medicine. °· Genital herpes, hepatitis, and HIV can be treated, but not cured, with prescribed medicines. The medicines lessen symptoms. °· Genital warts from HPV can be treated with medicine or by freezing, burning (electrocautery), or surgery. Warts may come back. °· HPV cannot be cured with medicine or surgery. However, abnormal areas may be removed from the cervix, vagina, or vulva. °· If your diagnosis is confirmed, your recent sexual partners need treatment. This is true even if they are symptom-free or have a negative culture or evaluation. They should not have sex until their health care providers say it is okay. °· Your health care provider may test you for infection again 3 months after treatment. °HOW CAN I REDUCE MY RISK OF GETTING AN STD? °Take these steps to reduce your risk of getting an STD: °· Use latex condoms, dental dams, and water-soluble lubricants during sexual activity. Do not use petroleum jelly or oils. °· Avoid having multiple sex partners. °· Do not have sex with someone who has other sex partners °· Do not have sex with anyone you do not know or who is at high risk for an STD. °· Avoid risky sex practices that can break your skin. °· Do not have sex if you have open sores on your mouth or skin. °· Avoid drinking too much alcohol or taking illegal drugs. Alcohol and drugs can affect your judgment and put you in a vulnerable position. °·   Avoid engaging in oral and anal sex acts. °· Get vaccinated for HPV and hepatitis. If you have not received these vaccines in the past, talk to your health care provider about whether one or both might be right for you. °· If you are at risk of being infected with HIV, it is recommended that you take a prescription medicine daily to prevent HIV infection. This is called pre-exposure prophylaxis (PrEP). You are considered at risk if: °¨ You are a man who has sex with other men (MSM). °¨ You are a heterosexual man or woman and are  sexually active with more than one partner. °¨ You take drugs by injection. °¨ You are sexually active with a partner who has HIV. °· Talk with your health care provider about whether you are at high risk of being infected with HIV. If you choose to begin PrEP, you should first be tested for HIV. You should then be tested every 3 months for as long as you are taking PrEP. °WHAT SHOULD I DO IF I THINK I HAVE AN STD? °· See your health care provider. °· Tell your sexual partner(s). They should be tested and treated for any STDs. °· Do not have sex until your health care provider says it is okay. °WHEN SHOULD I GET IMMEDIATE MEDICAL CARE? °Contact your health care provider right away if:  °· You have severe abdominal pain. °· You are a man and notice swelling or pain in your testicles. °· You are a woman and notice swelling or pain in your vagina. °  °This information is not intended to replace advice given to you by your health care provider. Make sure you discuss any questions you have with your health care provider. °  °Document Released: 08/22/2002 Document Revised: 06/22/2014 Document Reviewed: 12/20/2012 °Elsevier Interactive Patient Education ©2016 Elsevier Inc. ° °

## 2015-07-01 LAB — GC/CHLAMYDIA PROBE AMP (~~LOC~~) NOT AT ARMC
CHLAMYDIA, DNA PROBE: NEGATIVE
Neisseria Gonorrhea: NEGATIVE

## 2015-09-09 ENCOUNTER — Emergency Department (HOSPITAL_COMMUNITY)
Admission: EM | Admit: 2015-09-09 | Discharge: 2015-09-09 | Disposition: A | Discharge status: Home / Self Care | Payer: Medicaid Other | Attending: Emergency Medicine | Admitting: Emergency Medicine

## 2015-09-09 ENCOUNTER — Encounter (HOSPITAL_COMMUNITY): Payer: Self-pay | Admitting: Emergency Medicine

## 2015-09-09 DIAGNOSIS — R05 Cough: Secondary | ICD-10-CM

## 2015-09-09 DIAGNOSIS — R0781 Pleurodynia: Secondary | ICD-10-CM | POA: Insufficient documentation

## 2015-09-09 DIAGNOSIS — J069 Acute upper respiratory infection, unspecified: Secondary | ICD-10-CM | POA: Diagnosis not present

## 2015-09-09 DIAGNOSIS — J309 Allergic rhinitis, unspecified: Secondary | ICD-10-CM | POA: Insufficient documentation

## 2015-09-09 DIAGNOSIS — Z791 Long term (current) use of non-steroidal anti-inflammatories (NSAID): Secondary | ICD-10-CM | POA: Diagnosis not present

## 2015-09-09 DIAGNOSIS — R059 Cough, unspecified: Secondary | ICD-10-CM

## 2015-09-09 MED ORDER — CETIRIZINE HCL 10 MG PO TABS: 10.0000 mg | ORAL_TABLET | Freq: Every day | ORAL | Status: DC

## 2015-09-09 MED ORDER — FLUTICASONE PROPIONATE 50 MCG/ACT NA SUSP: 2.0000 | Freq: Every day | NASAL | Status: DC

## 2015-09-09 MED ORDER — PREDNISONE 20 MG PO TABS
60.0000 mg | ORAL_TABLET | Freq: Once | ORAL | Status: AC
  Administered 2015-09-09: 60 mg via ORAL
  Filled 2015-09-09: qty 3

## 2015-09-09 MED ORDER — IBUPROFEN 800 MG PO TABS: 800.0000 mg | ORAL_TABLET | Freq: Three times a day (TID) | ORAL | Status: DC

## 2015-09-09 MED ORDER — OXYCODONE-ACETAMINOPHEN 5-325 MG PO TABS
1.0000 | ORAL_TABLET | ORAL | Status: DC | PRN
  Administered 2015-09-09: 1 via ORAL
  Filled 2015-09-09: qty 1

## 2015-09-09 MED ORDER — IBUPROFEN 800 MG PO TABS
800.0000 mg | ORAL_TABLET | Freq: Once | ORAL | Status: AC
  Administered 2015-09-09: 800 mg via ORAL
  Filled 2015-09-09: qty 1

## 2015-09-09 MED ORDER — GUAIFENESIN-CODEINE 100-10 MG/5ML PO SOLN: 5.0000 mL | Freq: Four times a day (QID) | ORAL | Status: DC | PRN

## 2015-09-09 MED ORDER — PREDNISONE 20 MG PO TABS: 40.0000 mg | ORAL_TABLET | Freq: Every day | ORAL | Status: DC

## 2015-09-09 MED ORDER — BENZONATATE 100 MG PO CAPS
200.0000 mg | ORAL_CAPSULE | Freq: Once | ORAL | Status: AC
  Administered 2015-09-09: 200 mg via ORAL
  Filled 2015-09-09: qty 2

## 2015-09-09 NOTE — ED Notes (Signed)
Pain medication given in Triage. Patient advised about side effects of medications and  to avoid driving for a minimum of 4 hours.  

## 2015-09-09 NOTE — ED Notes (Signed)
Pt states that she has been having cough, nasal congestion, body aches x 1 wk.  Denies NVD.

## 2015-09-09 NOTE — Discharge Instructions (Signed)
Costochondritis Costochondritis, sometimes called Tietze syndrome, is a swelling and irritation (inflammation) of the tissue (cartilage) that connects your ribs with your breastbone (sternum). It causes pain in the chest and rib area. Costochondritis usually goes away on its own over time. It can take up to 6 weeks or longer to get better, especially if you are unable to limit your activities. CAUSES  Some cases of costochondritis have no known cause. Possible causes include:  Injury (trauma).  Exercise or activity such as lifting.  Severe coughing. SIGNS AND SYMPTOMS  Pain and tenderness in the chest and rib area.  Pain that gets worse when coughing or taking deep breaths.  Pain that gets worse with specific movements. DIAGNOSIS  Your health care provider will do a physical exam and ask about your symptoms. Chest X-rays or other tests may be done to rule out other problems. TREATMENT  Costochondritis usually goes away on its own over time. Your health care provider may prescribe medicine to help relieve pain. HOME CARE INSTRUCTIONS   Avoid exhausting physical activity. Try not to strain your ribs during normal activity. This would include any activities using chest, abdominal, and side muscles, especially if heavy weights are used.  Apply ice to the affected area for the first 2 days after the pain begins.  Put ice in a plastic bag.  Place a towel between your skin and the bag.  Leave the ice on for 20 minutes, 2-3 times a day.  Only take over-the-counter or prescription medicines as directed by your health care provider. SEEK MEDICAL CARE IF:  You have redness or swelling at the rib joints. These are signs of infection.  Your pain does not go away despite rest or medicine. SEEK IMMEDIATE MEDICAL CARE IF:   Your pain increases or you are very uncomfortable.  You have shortness of breath or difficulty breathing.  You cough up blood.  You have worse chest pains,  sweating, or vomiting.  You have a fever or persistent symptoms for more than 2-3 days.  You have a fever and your symptoms suddenly get worse. MAKE SURE YOU:   Understand these instructions.  Will watch your condition.  Will get help right away if you are not doing well or get worse.   This information is not intended to replace advice given to you by your health care provider. Make sure you discuss any questions you have with your health care provider.   Document Released: 03/11/2005 Document Revised: 03/22/2013 Document Reviewed: 01/03/2013 Elsevier Interactive Patient Education 2016 Else[employer] Inc[employer]   Upper Respiratory Infection, Adult Most upper respiratory infections (URIs) are a viral infection of the air passages leading to the lungs. A URI affects the nose, throat, and upper air passages. The most common type of URI is nasopharyngitis and is typically referred to as "the common cold." URIs run their course and usually go away on their own. Most of the time, a URI does not require medical attention, but sometimes a bacterial infection in the upper airways can follow a viral infection. This is called a secondary infection. Sinus and middle ear infections are common types of secondary upper respiratory infections. Bacterial pneumonia can also complicate a URI. A URI can worsen asthma and chronic obstructive pulmonary disease (COPD). Sometimes, these complications can require emergency medical care and may be life threatening.  CAUSES Almost all URIs are caused by viruses. A virus is a type of germ and can spread from one person to another.  RISKS FACTORS You  may be at risk for a URI if:   You smoke.   You have chronic heart or lung disease.  You have a weakened defense (immune) system.   You are very young or very old.   You have nasal allergies or asthma.  You work in crowded or poorly ventilated areas.  You work in health care facilities or schools. SIGNS AND  SYMPTOMS  Symptoms typically develop 2-3 days after you come in contact with a cold virus. Most viral URIs last 7-10 days. However, viral URIs from the influenza virus (flu virus) can last 14-18 days and are typically more severe. Symptoms may include:   Runny or stuffy (congested) nose.   Sneezing.   Cough.   Sore throat.   Headache.   Fatigue.   Fever.   Loss of appetite.   Pain in your forehead, behind your eyes, and over your cheekbones (sinus pain).  Muscle aches.  DIAGNOSIS  Your health care provider may diagnose a URI by:  Physical exam.  Tests to check that your symptoms are not due to another condition such as:  Strep throat.  Sinusitis.  Pneumonia.  Asthma. TREATMENT  A URI goes away on its own with time. It cannot be cured with medicines, but medicines may be prescribed or recommended to relieve symptoms. Medicines may help:  Reduce your fever.  Reduce your cough.  Relieve nasal congestion. HOME CARE INSTRUCTIONS   Take medicines only as directed by your health care provider.   Gargle warm saltwater or take cough drops to comfort your throat as directed by your health care provider.  Use a warm mist humidifier or inhale steam from a shower to increase air moisture. This may make it easier to breathe.  Drink enough fluid to keep your urine clear or pale yellow.   Eat soups and other clear broths and maintain good nutrition.   Rest as needed.   Return to work when your temperature has returned to normal or as your health care provider advises. You may need to stay home longer to avoid infecting others. You can also use a face mask and careful hand washing to prevent spread of the virus.  Increase the usage of your inhaler if you have asthma.   Do not use any tobacco products, including cigarettes, chewing tobacco, or electronic cigarettes. If you need help quitting, ask your health care provider. PREVENTION  The best way to  protect yourself from getting a cold is to practice good hygiene.   Avoid oral or hand contact with people with cold symptoms.   Wash your hands often if contact occurs.  There is no clear evidence that vitamin C, vitamin E, echinacea, or exercise reduces the chance of developing a cold. However, it is always recommended to get plenty of rest, exercise, and practice good nutrition.  SEEK MEDICAL CARE IF:   You are getting worse rather than better.   Your symptoms are not controlled by medicine.   You have chills.  You have worsening shortness of breath.  You have Decoster or red mucus.  You have yellow or Kleinert nasal discharge.  You have pain in your face, especially when you bend forward.  You have a fever.  You have swollen neck glands.  You have pain while swallowing.  You have white areas in the back of your throat. SEEK IMMEDIATE MEDICAL CARE IF:   You have severe or persistent:  Headache.  Ear pain.  Sinus pain.  Chest pain.  You  have chronic lung disease and any of the following:  Wheezing.  Prolonged cough.  Coughing up blood.  A change in your usual mucus.  You have a stiff neck.  You have changes in your:  Vision.  Hearing.  Thinking.  Mood. MAKE SURE YOU:   Understand these instructions.  Will watch your condition.  Will get help right away if you are not doing well or get worse.   This information is not intended to replace advice given to you by your health care provider. Make sure you discuss any questions you have with your health care provider.   Document Released: 11/25/2000 Document Revised: 10/16/2014 Document Reviewed: 09/06/2013 Elsevier Interactive Patient Education 2016 Elsevier Inc.  Cough, Adult Coughing is a reflex that clears your throat and your airways. Coughing helps to heal and protect your lungs. It is normal to cough occasionally, but a cough that happens with other symptoms or lasts a long time may be a  sign of a condition that needs treatment. A cough may last only 2-3 weeks (acute), or it may last longer than 8 weeks (chronic). CAUSES Coughing is commonly caused by:  Breathing in substances that irritate your lungs.  A viral or bacterial respiratory infection.  Allergies.  Asthma.  Postnasal drip.  Smoking.  Acid backing up from the stomach into the esophagus (gastroesophageal reflux).  Certain medicines.  Chronic lung problems, including COPD (or rarely, lung cancer).  Other medical conditions such as heart failure. HOME CARE INSTRUCTIONS  Pay attention to any changes in your symptoms. Take these actions to help with your discomfort:  Take medicines only as told by your health care provider.  If you were prescribed an antibiotic medicine, take it as told by your health care provider. Do not stop taking the antibiotic even if you start to feel better.  Talk with your health care provider before you take a cough suppressant medicine.  Drink enough fluid to keep your urine clear or pale yellow.  If the air is dry, use a cold steam vaporizer or humidifier in your bedroom or your home to help loosen secretions.  Avoid anything that causes you to cough at work or at home.  If your cough is worse at night, try sleeping in a semi-upright position.  Avoid cigarette smoke. If you smoke, quit smoking. If you need help quitting, ask your health care provider.  Avoid caffeine.  Avoid alcohol.  Rest as needed. SEEK MEDICAL CARE IF:   You have new symptoms.  You cough up pus.  Your cough does not get better after 2-3 weeks, or your cough gets worse.  You cannot control your cough with suppressant medicines and you are losing sleep.  You develop pain that is getting worse or pain that is not controlled with pain medicines.  You have a fever.  You have unexplained weight loss.  You have night sweats. SEEK IMMEDIATE MEDICAL CARE IF:  You cough up blood.  You have  difficulty breathing.  Your heartbeat is very fast.   This information is not intended to replace advice given to you by your health care provider. Make sure you discuss any questions you have with your health care provider.   Document Released: 11/28/2010 Document Revised: 02/20/2015 Document Reviewed: 08/08/2014 Elsevier Interactive Patient Education 2016 [employer].  Allergic Rhinitis Allergic rhinitis is when the mucous membranes in the nose respond to allergens. Allergens are particles in the air that cause your body to have an allergic reaction. This  causes you to release allergic antibodies. Through a chain of events, these eventually cause you to release histamine into the blood stream. Although meant to protect the body, it is this release of histamine that causes your discomfort, such as frequent sneezing, congestion, and an itchy, runny nose.  CAUSES Seasonal allergic rhinitis (hay fever) is caused by pollen allergens that may come from grasses, trees, and weeds. Year-round allergic rhinitis (perennial allergic rhinitis) is caused by allergens such as house dust mites, pet dander, and mold spores. SYMPTOMS  Nasal stuffiness (congestion).  Itchy, runny nose with sneezing and tearing of the eyes. DIAGNOSIS Your health care provider can help you determine the allergen or allergens that trigger your symptoms. If you and your health care provider are unable to determine the allergen, skin or blood testing may be used. Your health care provider will diagnose your condition after taking your health history and performing a physical exam. Your health care provider may assess you for other related conditions, such as asthma, pink eye, or an ear infection. TREATMENT Allergic rhinitis does not have a cure, but it can be controlled by:  Medicines that block allergy symptoms. These may include allergy shots, nasal sprays, and oral antihistamines.  Avoiding the allergen. Hay fever may often  be treated with antihistamines in pill or nasal spray forms. Antihistamines block the effects of histamine. There are over-the-counter medicines that may help with nasal congestion and swelling around the eyes. Check with your health care provider before taking or giving this medicine. If avoiding the allergen or the medicine prescribed do not work, there are many new medicines your health care provider can prescribe. Stronger medicine may be used if initial measures are ineffective. Desensitizing injections can be used if medicine and avoidance does not work. Desensitization is when a patient is given ongoing shots until the body becomes less sensitive to the allergen. Make sure you follow up with your health care provider if problems continue. HOME CARE INSTRUCTIONS It is not possible to completely avoid allergens, but you can reduce your symptoms by taking steps to limit your exposure to them. It helps to know exactly what you are allergic to so that you can avoid your specific triggers. SEEK MEDICAL CARE IF:  You have a fever.  You develop a cough that does not stop easily (persistent).  You have shortness of breath.  You start wheezing.  Symptoms interfere with normal daily activities.   This information is not intended to replace advice given to you by your health care provider. Make sure you discuss any questions you have with your health care provider.   Document Released: 02/24/2001 Document Revised: 06/22/2014 Document Reviewed: 02/06/2013 Elsevier Interactive Patient Education [employer]

## 2015-09-09 NOTE — ED Provider Notes (Signed)
CSN: [id number]     Arrival date & time 09/09/15  1150 History  By signing my name below, I, [facility] Hospital[facility], attest that this documentation has been prepared under the direction and in the presence of Danelle BerryLeisa Rashay Barnette, PA-C. Electronically Signed: Randell PatientMarrissa Washington, ED Scribe. 09/09/2015. 12:58 PM.    Chief Complaint  Patient presents with  . Cough  . Generalized Body Aches   The history is provided by the patient. No language interpreter was used.   HPI Comments: Brooke BarretteDana Lewis is a 40 y.o. female who presents to the Emergency Department complaining of constant, gradually improving, generalized body aches that extends from her neck down to her toes onset 1 week. Patient reports pain severity was 5/10 but improved in ED after taking Percocet.  She endorses associated sore throat, non-productive cough, nasal congestion, and sore epigastric abdominal pain that she associates with her cough, intermittent fatigue that have all resolved except for the cough. Pain worse with cough. She has not taken any medications or tried any treatments. She notes recent sick contact with her son who has similar symptoms. Denies hx of cigarette smoking. Denies pregnancy currently. Denies rhinorrhea, SOB, chest tightness, wheezing, chills, sweats, rash, nausea, vomiting, diarrhea, fevers.   Past Medical History  Diagnosis Date  . No pertinent past medical history   . Medical history non-contributory    Past Surgical History  Procedure Laterality Date  . No past surgeries    . Tubal ligation N/A 06/22/2013    Procedure: POST PARTUM TUBAL LIGATION;  Surgeon: Willodean Rosenthalarolyn Harraway-Smith, MD;  Location: [facility];  Service: Gynecology;  Laterality: N/A;   Family History  Problem Relation Age of Onset  . Diabetes Mother   . Hypertension Mother   . Diabetes Maternal Grandmother   . Cancer Neg Hx    Social History  Substance Use Topics  . Smoking status: Never Smoker   . Smokeless tobacco: Never Used  . Alcohol Use: No    OB History    Gravida Para Term Preterm AB TAB SAB Ectopic Multiple Living   4 4 4       4      Review of Systems A complete 10 system review of systems was obtained and all systems are negative except as noted in the HPI and PMH.    Allergies  Review of patient's allergies indicates no known allergies.  Home Medications   Prior to Admission medications   Medication Sig Start Date End Date Taking? Authorizing Provider  cetirizine (ZYRTEC ALLERGY) 10 MG tablet Take 1 tablet (10 mg total) by mouth daily. 09/09/15   Danelle BerryLeisa Jennae Hakeem, PA-C  fluticasone (FLONASE) 50 MCG/ACT nasal spray Place 2 sprays into both nostrils daily. 09/09/15   Danelle BerryLeisa Jilliam Bellmore, PA-C  guaiFENesin-codeine 100-10 MG/5ML syrup Take 5 mLs by mouth every 6 (six) hours as needed for cough. 09/09/15   Danelle BerryLeisa Jacier Gladu, PA-C  ibuprofen (ADVIL,MOTRIN) 800 MG tablet Take 1 tablet (800 mg total) by mouth 3 (three) times daily. 09/09/15   Danelle BerryLeisa Melea Prezioso, PA-C  metroNIDAZOLE (FLAGYL) 500 MG tablet Take 1 tablet (500 mg total) by mouth 2 (two) times daily. 06/30/15   Roxy Horsemanobert Browning, PA-C  metroNIDAZOLE (METROGEL VAGINAL) 0.75 % vaginal gel Place 1 Applicatorful vaginally 2 (two) times daily. Patient not taking: Reported on 06/30/2015 05/30/15   Brock Badharles A Harper, MD  predniSONE (DELTASONE) 20 MG tablet Take 2 tablets (40 mg total) by mouth daily. 09/09/15   Danelle BerryLeisa Odelle Kosier, PA-C   BP 109/54 mmHg  Pulse 72  Temp(Src)  98.1 F (36.7 C) (Oral)  Resp 18  SpO2 98% Physical Exam  Constitutional: She is oriented to person, place, and time. She appears well-developed and well-nourished.  HENT:  Head: Normocephalic and atraumatic.  Right Ear: Tympanic membrane and ear canal normal.  Left Ear: Tympanic membrane and ear canal normal.  Nose: Mucosal edema present.  Mouth/Throat: Uvula is midline and oropharynx is clear and moist. Mucous membranes are pale. No oropharyngeal exudate, posterior oropharyngeal edema, posterior oropharyngeal erythema or tonsillar  abscesses.  Nasal mucosa edematous and pale.  Eyes: Conjunctivae are normal.  Neck:  No lymphadenopathy.  Cardiovascular: Normal rate and normal heart sounds.   Pulmonary/Chest: Effort normal. No respiratory distress. She has no wheezes. She has no rales.  Anterior ribs TTP. Lungs CTA bilaterally.  Abdominal: Soft. There is no tenderness.  Musculoskeletal: Normal range of motion.  Lymphadenopathy:    She has no cervical adenopathy.  Neurological: She is alert and oriented to person, place, and time.  Skin: Skin is warm and dry. No rash noted.  Psychiatric: She has a normal mood and affect.    ED Course  Procedures   DIAGNOSTIC STUDIES: Oxygen Saturation is 98% on RA, normal by my interpretation.    COORDINATION OF CARE: 6:44 PM Will order prednisone, Tessalon, ibuprofen. Discussed risks and benefits of ordering a chest x-ray and patient and I agree to not order this imaging today. Discussed treatment plan with pt at bedside and pt agreed to plan.    MDM   Final diagnoses:  URI (upper respiratory infection)  Cough  Rib pain  Allergic rhinitis, unspecified allergic rhinitis type    Pt symptoms consistent with URI. Due to patient's current symptoms, a CXR was not indicated or ordered in the ED today. Pt will be discharged with symptomatic treatment.  Discussed return precautions.  Pt is hemodynamically stable & in NAD prior to discharge.   I personally performed the services described in this documentation, which was scribed in my presence. The recorded information has been reviewed and is accurate.     Danelle Berry, PA-C 09/14/15 1259  Gwyneth Sprout, MD 09/16/15 [phone]

## 2016-06-01 ENCOUNTER — Ambulatory Visit: Payer: Self-pay | Admitting: Obstetrics

## 2016-06-25 ENCOUNTER — Telehealth: Payer: Self-pay | Admitting: *Deleted

## 2016-06-25 NOTE — Telephone Encounter (Signed)
Pt states she was told to call. Pt's call was unclear. I left message for pt to call again and I would help.

## 2016-07-03 ENCOUNTER — Encounter: Payer: Self-pay | Admitting: Podiatry

## 2016-07-03 ENCOUNTER — Ambulatory Visit (INDEPENDENT_AMBULATORY_CARE_PROVIDER_SITE_OTHER): Payer: BLUE CROSS/BLUE SHIELD | Admitting: Podiatry

## 2016-07-03 VITALS — BP 114/73 | Resp 16 | Ht 66.0 in | Wt 192.0 lb

## 2016-07-03 DIAGNOSIS — M779 Enthesopathy, unspecified: Secondary | ICD-10-CM

## 2016-07-03 DIAGNOSIS — L84 Corns and callosities: Secondary | ICD-10-CM

## 2016-07-03 DIAGNOSIS — M216X9 Other acquired deformities of unspecified foot: Secondary | ICD-10-CM | POA: Diagnosis not present

## 2016-07-03 MED ORDER — TRIAMCINOLONE ACETONIDE 10 MG/ML IJ SUSP
10.0000 mg | Freq: Once | INTRAMUSCULAR | Status: AC
  Administered 2016-07-03: 10 mg

## 2016-07-03 NOTE — Progress Notes (Signed)
   Subjective:    Patient ID: Brooke BarretteDana Lymon, female    DOB: January 25, 1976, 41 y.o.   MRN: [id number]  HPI  Chief Complaint  Patient presents with  . Skin Lesion    Right; Plantar forefoot; beneath 5th toe x "few years". Pt states that the lesion "began hurting x 9 months"        Review of Systems     Objective:   Physical Exam        Assessment & Plan:

## 2016-07-05 NOTE — Progress Notes (Signed)
Subjective:     Patient ID: Brooke Lewis, female   DOB: Oct 16, 1975, 41 y.o.   MRN: [id number]  HPI patient presents stating she's had a chronic lesion underneath her right foot that's increasingly make it hard to walk or wear shoe gear comfortably. States it's been present for around 9 months   Review of Systems  All other systems reviewed and are negative.      Objective:   Physical Exam  Constitutional: She is oriented to person, place, and time.  Cardiovascular: Intact distal pulses.   Musculoskeletal: Normal range of motion.  Neurological: She is oriented to person, place, and time.  Skin: Skin is warm.  Nursing note and vitals reviewed.  neurovascular status intact muscle strength adequate range of motion within normal limits with patient found to have inflammation and pain underneath the fifth metatarsal head right with fluid buildup around the surface keratotic lesion formation and offloading of the area noted upon gait analysis. 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 I do think that this will require sub-capsular injection 3 mg Dexon some Kenalog 5 mg Xylocaine which was accomplished and today I debrided lesion fully and applied padding. May ultimately require deep orthotic and could ultimately require surgical lifting of the metatarsal  X-ray report indicated that there is some stress on the fifth metatarsal but no indications of arthritis or other pathological condition

## 2016-08-02 ENCOUNTER — Other Ambulatory Visit: Payer: Self-pay | Admitting: Obstetrics

## 2016-08-02 DIAGNOSIS — B9689 Other specified bacterial agents as the cause of diseases classified elsewhere: Secondary | ICD-10-CM

## 2016-08-02 DIAGNOSIS — N760 Acute vaginitis: Principal | ICD-10-CM

## 2016-11-04 ENCOUNTER — Other Ambulatory Visit: Payer: Self-pay | Admitting: Obstetrics

## 2016-11-04 DIAGNOSIS — B9689 Other specified bacterial agents as the cause of diseases classified elsewhere: Secondary | ICD-10-CM

## 2016-11-04 DIAGNOSIS — N760 Acute vaginitis: Principal | ICD-10-CM

## 2016-11-04 MED ORDER — METRONIDAZOLE 0.75 % VA GEL: 1.0000 | Freq: Two times a day (BID) | VAGINAL | 2 refills | Status: DC

## 2017-08-18 ENCOUNTER — Telehealth: Payer: Self-pay

## 2017-08-18 ENCOUNTER — Other Ambulatory Visit: Payer: Self-pay | Admitting: Obstetrics

## 2017-08-18 DIAGNOSIS — N760 Acute vaginitis: Principal | ICD-10-CM

## 2017-08-18 DIAGNOSIS — B9689 Other specified bacterial agents as the cause of diseases classified elsewhere: Secondary | ICD-10-CM

## 2017-08-18 NOTE — Telephone Encounter (Signed)
TC from pt c/o abnormal vaginal discharge and requests Metrogel. She has not been seen since 2016 so she needs a VE with an annual. Pt voiced understanding. Appt desk notified.

## 2017-09-15 ENCOUNTER — Other Ambulatory Visit (HOSPITAL_COMMUNITY)
Admission: RE | Admit: 2017-09-15 | Discharge: 2017-09-15 | Disposition: A | Discharge status: Home / Self Care | Payer: Medicaid Other | Source: Ambulatory Visit | Attending: Obstetrics | Admitting: Obstetrics

## 2017-09-15 ENCOUNTER — Ambulatory Visit (INDEPENDENT_AMBULATORY_CARE_PROVIDER_SITE_OTHER): Payer: Medicaid Other | Admitting: Obstetrics

## 2017-09-15 ENCOUNTER — Encounter: Payer: Self-pay | Admitting: Obstetrics

## 2017-09-15 VITALS — BP 102/59 | HR 79 | Temp 97.8°F | Resp 16 | Wt 159.7 lb

## 2017-09-15 DIAGNOSIS — Z0001 Encounter for general adult medical examination with abnormal findings: Secondary | ICD-10-CM

## 2017-09-15 DIAGNOSIS — Z01419 Encounter for gynecological examination (general) (routine) without abnormal findings: Secondary | ICD-10-CM | POA: Diagnosis not present

## 2017-09-15 DIAGNOSIS — N898 Other specified noninflammatory disorders of vagina: Secondary | ICD-10-CM | POA: Diagnosis not present

## 2017-09-15 NOTE — Progress Notes (Signed)
Subjective:        Brooke Lewis is a 42 y.o. female here for a routine exam.  Current complaints: None.    Personal health questionnaire:  Is patient Ashkenazi Jewish, have a family history of breast and/or ovarian cancer: no Is there a family history of uterine cancer diagnosed at age < 74, gastrointestinal cancer, urinary tract cancer, family member who is a Personnel officer syndrome-associated carrier: no Is the patient overweight and hypertensive, family history of diabetes, personal history of gestational diabetes, preeclampsia or PCOS: no Is patient over 64, have PCOS,  family history of premature CHD under age 53, diabetes, smoke, have hypertension or peripheral artery disease:  no At any time, has a partner hit, kicked or otherwise hurt or frightened you?: no Over the past 2 weeks, have you felt down, depressed or hopeless?: no Over the past 2 weeks, have you felt little interest or pleasure in doing things?:no   Gynecologic History Patient's last menstrual period was 09/01/2017 (exact date). Contraception: tubal ligation Last Pap: 2016. Results were: normal Last mammogram: n/a. Results were: n/a  Obstetric History OB History  Gravida Para Term Preterm AB Living  4 4 4     4   SAB TAB Ectopic Multiple Live Births          4    # Outcome Date GA Lbr Len/2nd Weight Sex Delivery Anes PTL Lv  4 Term 06/22/13 [redacted]w[redacted]d 14:26 / 00:23 6 lb 6 oz (2.892 kg) F Vag-Spont None  LIV  3 Term 09/04/11 [redacted]w[redacted]d 02:26 / 00:07 7 lb 7 oz (3.374 kg) M Vag-Spont None  LIV  2 Term 2011    M Vag-Spont None  LIV  1 Term 2006    M Vag-Spont None  LIV    Past Medical History:  Diagnosis Date  . Medical history non-contributory   . No pertinent past medical history     Past Surgical History:  Procedure Laterality Date  . NO PAST SURGERIES    . TUBAL LIGATION N/A 06/22/2013   Procedure: POST PARTUM TUBAL LIGATION;  Surgeon: Willodean Rosenthal, MD;  Location: [facility];  Service: Gynecology;  Laterality: N/A;     No current outpatient medications on file. No Known Allergies  Social History   Tobacco Use  . Smoking status: Never Smoker  . Smokeless tobacco: Never Used  Substance Use Topics  . Alcohol use: No    Alcohol/week: 0.0 oz    Family History  Problem Relation Age of Onset  . Diabetes Mother   . Hypertension Mother   . Diabetes Maternal Grandmother   . Cancer Neg Hx       Review of Systems  Constitutional: negative for fatigue and weight loss Respiratory: negative for cough and wheezing Cardiovascular: negative for chest pain, fatigue and palpitations Gastrointestinal: negative for abdominal pain and change in bowel habits Musculoskeletal:negative for myalgias Neurological: negative for gait problems and tremors Behavioral/Psych: negative for abusive relationship, depression Endocrine: negative for temperature intolerance    Genitourinary:negative for abnormal menstrual periods, genital lesions, hot flashes, sexual problems and vaginal discharge Integument/breast: negative for breast lump, breast tenderness, nipple discharge and skin lesion(s)    Objective:       BP (!) 102/59 (BP Location: Right Arm, Patient Position: Sitting, Cuff Size: Large)   Pulse 79   Temp 97.8 F (36.6 C) (Oral)   Resp 16   Wt 159 lb 11.2 oz (72.4 kg)   LMP 09/01/2017 (Exact Date)   BMI 25.78 kg/m  General:   alert  Skin:   no rash or abnormalities  Lungs:   clear to auscultation bilaterally  Heart:   regular rate and rhythm, S1, S2 normal, no murmur, click, rub or gallop  Breasts:   normal without suspicious masses, skin or nipple changes or axillary nodes  Abdomen:  normal findings: no organomegaly, soft, non-tender and no hernia  Pelvis:  External genitalia: normal general appearance Urinary system: urethral meatus normal and bladder without fullness, nontender Vaginal: normal without tenderness, induration or masses Cervix: normal appearance Adnexa: normal bimanual exam Uterus:  anteverted and non-tender, normal size   Lab Review Urine pregnancy test Labs reviewed yes Radiologic studies reviewed no  50% of 20 min visit spent on counseling and coordination of care.   Assessment:     1. Encounter for routine gynecological examination with Papanicolaou smear of cervix Rx: - Cytology - PAP  2. Vaginal discharge Rx: - Cervicovaginal ancillary only   Plan:    Education reviewed: calcium supplements, depression evaluation, low fat, low cholesterol diet, safe sex/STD prevention, self breast exams and weight bearing exercise. Contraception: tubal ligation. Follow up in: 1 year.   No orders of the defined types were placed in this encounter.  No orders of the defined types were placed in this encounter.   Brock BadHARLES A. HARPER MD 09-15-2017

## 2017-09-16 LAB — CERVICOVAGINAL ANCILLARY ONLY
BACTERIAL VAGINITIS: POSITIVE — AB
CANDIDA VAGINITIS: NEGATIVE
Chlamydia: NEGATIVE
NEISSERIA GONORRHEA: NEGATIVE
TRICH (WINDOWPATH): NEGATIVE

## 2017-09-17 ENCOUNTER — Other Ambulatory Visit: Payer: Self-pay | Admitting: Obstetrics

## 2017-09-17 DIAGNOSIS — N760 Acute vaginitis: Principal | ICD-10-CM

## 2017-09-17 DIAGNOSIS — B9689 Other specified bacterial agents as the cause of diseases classified elsewhere: Secondary | ICD-10-CM

## 2017-09-17 LAB — CYTOLOGY - PAP
Diagnosis: NEGATIVE
HPV (WINDOPATH): NOT DETECTED

## 2017-09-17 MED ORDER — SECNIDAZOLE 2 G PO PACK: 1.0000 | PACK | Freq: Once | ORAL | 2 refills | Status: AC

## 2017-09-29 ENCOUNTER — Other Ambulatory Visit: Payer: Self-pay

## 2017-09-29 MED ORDER — SECNIDAZOLE 2 G PO PACK: 1.0000 | PACK | Freq: Once | ORAL | 2 refills | Status: AC

## 2017-09-29 NOTE — Progress Notes (Signed)
Pt needs [facility] on [address]. I called [facility] to get it transferred from [facility] [place] but they were unable to pull it out of [facility] system because [facility] so we need another rx.

## 2017-12-20 ENCOUNTER — Telehealth: Payer: Self-pay

## 2017-12-20 ENCOUNTER — Other Ambulatory Visit: Payer: Self-pay | Admitting: Obstetrics

## 2017-12-20 DIAGNOSIS — N760 Acute vaginitis: Principal | ICD-10-CM

## 2017-12-20 DIAGNOSIS — B9689 Other specified bacterial agents as the cause of diseases classified elsewhere: Secondary | ICD-10-CM

## 2017-12-20 MED ORDER — SECNIDAZOLE 2 G PO PACK: 1.0000 | PACK | Freq: Once | ORAL | 2 refills | Status: AC

## 2017-12-20 NOTE — Telephone Encounter (Signed)
TC to pt regarding Rx request of BV Ok per Dr.Harper to resend [place]  Rx pt made aware. [facility] CMA

## 2017-12-28 ENCOUNTER — Other Ambulatory Visit: Payer: Self-pay

## 2017-12-28 MED ORDER — SECNIDAZOLE 2 G PO PACK: 1.0000 | PACK | Freq: Once | ORAL | 0 refills | Status: DC

## 2018-02-17 ENCOUNTER — Telehealth: Payer: Self-pay

## 2018-02-17 ENCOUNTER — Other Ambulatory Visit: Payer: Self-pay | Admitting: Obstetrics

## 2018-02-17 DIAGNOSIS — B9689 Other specified bacterial agents as the cause of diseases classified elsewhere: Secondary | ICD-10-CM

## 2018-02-17 DIAGNOSIS — N760 Acute vaginitis: Principal | ICD-10-CM

## 2018-02-17 NOTE — Telephone Encounter (Signed)
Refill request on Solosec Rx.  Please send if approved.

## 2018-02-17 NOTE — Telephone Encounter (Signed)
Returned call, pt requesting rx for bv, routed to provider for review.

## 2018-02-18 ENCOUNTER — Telehealth: Payer: Self-pay

## 2018-02-18 NOTE — Telephone Encounter (Signed)
Called pt, to advise rx was sent, no answer, left vm

## 2018-02-18 NOTE — Telephone Encounter (Signed)
Solosec Rx.  Remind patient to mix granules with apple sauce, yogurt, pudding, etc.

## 2018-05-03 ENCOUNTER — Ambulatory Visit: Payer: Self-pay | Admitting: Obstetrics

## 2018-05-03 ENCOUNTER — Ambulatory Visit: Payer: Medicaid Other

## 2018-05-04 ENCOUNTER — Other Ambulatory Visit: Payer: Self-pay | Admitting: *Deleted

## 2018-05-04 MED ORDER — FLUCONAZOLE 150 MG PO TABS: 150.0000 mg | ORAL_TABLET | Freq: Once | ORAL | 0 refills | Status: DC

## 2018-05-04 NOTE — Progress Notes (Signed)
Pt called to office with symptoms of yeast infection, request tx.  Diflucan 150mg  sent to pharmacy. Advised to contact office if no change in symptoms.

## 2018-05-16 ENCOUNTER — Other Ambulatory Visit: Payer: Self-pay | Admitting: Obstetrics

## 2018-05-16 DIAGNOSIS — B9689 Other specified bacterial agents as the cause of diseases classified elsewhere: Secondary | ICD-10-CM

## 2018-05-16 DIAGNOSIS — N760 Acute vaginitis: Principal | ICD-10-CM

## 2018-05-23 ENCOUNTER — Other Ambulatory Visit: Payer: Self-pay | Admitting: Obstetrics

## 2018-05-23 ENCOUNTER — Telehealth: Payer: Self-pay

## 2018-05-23 DIAGNOSIS — N760 Acute vaginitis: Principal | ICD-10-CM

## 2018-05-23 DIAGNOSIS — B9689 Other specified bacterial agents as the cause of diseases classified elsewhere: Secondary | ICD-10-CM

## 2018-05-23 MED ORDER — METRONIDAZOLE 0.75 % VA GEL: 1.0000 | Freq: Two times a day (BID) | VAGINAL | 5 refills | Status: DC

## 2018-05-23 NOTE — Telephone Encounter (Signed)
Returned call, pt wants to change rx from solosec to [employer]Exmetrogel b/c its too expensive, routed to provider for review.

## 2018-05-25 ENCOUNTER — Telehealth: Payer: Self-pay

## 2018-05-25 NOTE — Telephone Encounter (Signed)
Called to advise rx was sent to pharmacy, no answer, left vm

## 2018-12-29 ENCOUNTER — Other Ambulatory Visit: Payer: Self-pay | Admitting: Obstetrics

## 2018-12-29 NOTE — Telephone Encounter (Signed)
Refill request from pharmacy    Name from pharmacy: FLUCONAZOLE 150MG  TABLETS       Will file in chart as: fluconazole (DIFLUCAN) 150 MG tablet   Sig: TAKE 1 TABLET BY MOUTH AS A SINGLE DOSE   Disp:  1 tablet  Refills:  0 (Pharmacy requested: Not specified)   Start: 12/29/2018   Class: Normal   Non-formulary   Last ordered: 7 months ago by Shelly Bombard, MD Last refill: 05/05/2018   Rx #: [id number]     To be filled at: [facility] [address] [facility] [address]

## 2019-06-05 ENCOUNTER — Other Ambulatory Visit: Payer: Self-pay | Admitting: Obstetrics

## 2019-06-05 DIAGNOSIS — B9689 Other specified bacterial agents as the cause of diseases classified elsewhere: Secondary | ICD-10-CM

## 2019-06-05 DIAGNOSIS — N760 Acute vaginitis: Secondary | ICD-10-CM

## 2019-08-06 ENCOUNTER — Encounter (HOSPITAL_COMMUNITY): Payer: Self-pay

## 2019-08-06 ENCOUNTER — Other Ambulatory Visit: Payer: Self-pay

## 2019-08-06 ENCOUNTER — Ambulatory Visit (HOSPITAL_COMMUNITY)
Admission: EM | Admit: 2019-08-06 | Discharge: 2019-08-06 | Disposition: A | Discharge status: Home / Self Care | Payer: Medicaid Other

## 2019-08-06 DIAGNOSIS — R590 Localized enlarged lymph nodes: Secondary | ICD-10-CM

## 2019-08-06 NOTE — ED Provider Notes (Signed)
[facility]ned    CSN: [id number] Arrival date & time: 08/06/19  1139      History   Chief Complaint Chief Complaint  Patient presents with  . lump on neck    HPI Taylour Lietzke is a 44 y.o. female.   Brandis Wixted presents with complaints of a palpable "knot" to the right distal and lateral aspect of her neck. She noted it first yesterday. Today has not increased in size at all, feels the same. Has not bothered her at all. Is not painful. No redness or warmth. No other dental or URI symptoms, no ear pain or sore throat. She doesn't smoke. Doesn't have a PCP.     ROS per HPI, negative if not otherwise mentioned.      Past Medical History:  Diagnosis Date  . Medical history non-contributory   . No pertinent past medical history     Patient Active Problem List   Diagnosis Date Noted  . Active labor 06/22/2013  . Sterilization 06/22/2013  . Normal delivery 09/05/2011    Past Surgical History:  Procedure Laterality Date  . NO PAST SURGERIES    . TUBAL LIGATION N/A 06/22/2013   Procedure: POST PARTUM TUBAL LIGATION;  Surgeon: Lavonia Drafts, MD;  Location: [facility];  Service: Gynecology;  Laterality: N/A;    OB History    Gravida  4   Para  4   Term  4   Preterm      AB      Living  4     SAB      TAB      Ectopic      Multiple      Live Births  4            Home Medications    Prior to Admission medications   Medication Sig Start Date End Date Taking? Authorizing Provider  fluconazole (DIFLUCAN) 150 MG tablet TAKE 1 TABLET BY MOUTH AS A SINGLE DOSE 12/31/18   Shelly Bombard, MD  metroNIDAZOLE (METROGEL) 0.75 % vaginal gel INSERT 1 APPLICATORFUL VAGINALLY TWICE DAILY 06/06/19   Shelly Bombard, MD  SOLOSEC 2 g PACK TAKE 1 PACKET BY MOUTH ONCE FOR 1 DOSE 05/16/18   Shelly Bombard, MD    Family History Family History  Problem Relation Age of Onset  . Diabetes Mother   . Hypertension Mother   . Diabetes Maternal  Grandmother   . Cancer Neg Hx     Social History Social History   Tobacco Use  . Smoking status: Never Smoker  . Smokeless tobacco: Never Used  Substance Use Topics  . Alcohol use: No    Alcohol/week: 0.0 standard drinks  . Drug use: No     Allergies   Shellfish allergy   Review of Systems Review of Systems   Physical Exam Triage Vital Signs ED Triage Vitals  Enc Vitals Group     BP 08/06/19 1148 (!) 152/85     Pulse Rate 08/06/19 1148 97     Resp 08/06/19 1148 18     Temp 08/06/19 1148 99 F (37.2 C)     Temp Source 08/06/19 1148 Oral     SpO2 08/06/19 1148 96 %     Weight --      Height --      Head Circumference --      Peak Flow --      Pain Score 08/06/19 1150 0     Pain Loc --  Pain Edu? --      Excl. in GC? --    No data found.  Updated Vital Signs BP (!) 152/85 (BP Location: Left Arm)   Pulse 97   Temp 99 F (37.2 C) (Oral)   Resp 18   LMP 07/10/2019   SpO2 96%   Visual Acuity Right Eye Distance:   Left Eye Distance:   Bilateral Distance:    Right Eye Near:   Left Eye Near:    Bilateral Near:     Physical Exam Constitutional:      General: She is not in acute distress.    Appearance: She is well-developed.  Neck:      Comments: Rubbery, approximately 0.5 cm nodule palpation to right lateral and distal neck; no redness or warmth; some movement with palpation; no fluctuance; no skin changes; minimal tenderness only with significant palpation Cardiovascular:     Rate and Rhythm: Normal rate.  Pulmonary:     Effort: Pulmonary effort is normal.  Lymphadenopathy:     Cervical: Cervical adenopathy present.  Skin:    General: Skin is warm and dry.  Neurological:     Mental Status: She is alert and oriented to person, place, and time.      UC Treatments / Results  Labs (all labs ordered are listed, but only abnormal results are displayed) Labs Reviewed - No data to display  EKG   Radiology No results found.   Procedures Procedures (including critical care time)  Medications Ordered in UC Medications - No data to display  Initial Impression / Assessment and Plan / UC Course  I have reviewed the triage vital signs and the nursing notes.  Pertinent labs & imaging results that were available during my care of the patient were reviewed by me and considered in my medical decision making (see chart for details).     Consistent with a distal cervical adenopathy. No palpable supraclavicular adenopathy. Non tender. Soft and mobile. No URI symptoms at this time. Monitor over the next few weeks. Encouraged follow up with a PCP for recheck. Return precautions provided. Patient verbalized understanding and agreeable to plan.   Final Clinical Impressions(s) / UC Diagnoses   Final diagnoses:  Lymphadenopathy, cervical     Discharge Instructions     This appears consistent with a lymph node.  This likely will decrease in size in the next two weeks without any specific treatment.  I do recommend following up with a primary care provider for recheck, especially if this does happen to continue to get larger, then you would likely need additional evaluation.    ED Prescriptions    None     PDMP not reviewed this encounter.   Georgetta Haber, NP 08/06/19 1306

## 2019-08-06 NOTE — Discharge Instructions (Signed)
This appears consistent with a lymph node.  This likely will decrease in size in the next two weeks without any specific treatment.  I do recommend following up with a primary care provider for recheck, especially if this does happen to continue to get larger, then you would likely need additional evaluation.

## 2019-08-06 NOTE — ED Triage Notes (Signed)
Pt c/o painless "lump on right side of neck" since yesterday . Pt also states her neck is stiff. Denies sore throat, sinus congestion, fever, chills.

## 2020-01-09 ENCOUNTER — Other Ambulatory Visit: Payer: Self-pay | Admitting: Sports Medicine

## 2020-01-09 ENCOUNTER — Ambulatory Visit: Payer: Medicaid Other | Admitting: Sports Medicine

## 2020-01-09 ENCOUNTER — Encounter: Payer: Self-pay | Admitting: Sports Medicine

## 2020-01-09 ENCOUNTER — Other Ambulatory Visit: Payer: Self-pay

## 2020-01-09 ENCOUNTER — Ambulatory Visit (INDEPENDENT_AMBULATORY_CARE_PROVIDER_SITE_OTHER): Payer: Medicaid Other

## 2020-01-09 DIAGNOSIS — M2141 Flat foot [pes planus] (acquired), right foot: Secondary | ICD-10-CM

## 2020-01-09 DIAGNOSIS — M2142 Flat foot [pes planus] (acquired), left foot: Secondary | ICD-10-CM

## 2020-01-09 DIAGNOSIS — M79672 Pain in left foot: Secondary | ICD-10-CM

## 2020-01-09 DIAGNOSIS — M722 Plantar fascial fibromatosis: Secondary | ICD-10-CM | POA: Diagnosis not present

## 2020-01-09 DIAGNOSIS — M79671 Pain in right foot: Secondary | ICD-10-CM | POA: Diagnosis not present

## 2020-01-09 MED ORDER — PREDNISONE 10 MG (21) PO TBPK: ORAL_TABLET | ORAL | 0 refills | Status: DC

## 2020-01-09 MED ORDER — MELOXICAM 15 MG PO TABS: 15.0000 mg | ORAL_TABLET | Freq: Every day | ORAL | 0 refills | Status: DC

## 2020-01-09 NOTE — Progress Notes (Signed)
Subjective: Brooke Lewis is a 44 y.o. 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  Patient reports that pain on average is 5 out of 10. Denies any other pedal complaints.   Review of systems noncontributory.  Patient Active Problem List   Diagnosis Date Noted   Active labor 06/22/2013   Sterilization 06/22/2013   Normal delivery 09/05/2011    Current Outpatient Medications on File Prior to Visit  Medication Sig Dispense Refill   fluconazole (DIFLUCAN) 150 MG tablet TAKE 1 TABLET BY MOUTH AS A SINGLE DOSE 1 tablet 0   metroNIDAZOLE (METROGEL) 0.75 % vaginal gel INSERT 1 APPLICATORFUL VAGINALLY TWICE DAILY 70 g 5   SOLOSEC 2 g PACK TAKE 1 PACKET BY MOUTH ONCE FOR 1 DOSE 1 each 2   No current facility-administered medications on file prior to visit.    Allergies  Allergen Reactions   Shellfish Allergy Nausea And Vomiting    Objective: Physical Exam General: The patient is alert and oriented x3 in no acute distress.  Dermatology: Skin is warm, dry and supple bilateral lower extremities. Nails 1-10 are normal. There is no erythema, edema, no eccymosis, no open lesions present. Integument is otherwise unremarkable.  Vascular: Dorsalis Pedis pulse and Posterior Tibial pulse are 2/4 bilateral. Capillary fill time is immediate to all digits.  Neurological: Grossly intact to light touch with an achilles reflex of +2/5 and a  negative Tinel's sign bilateral.  Musculoskeletal: Tenderness to palpation at the medial calcaneal tubercale and through the insertion of the plantar fascia on the left and right foot. No pain with compression of calcaneus bilateral. No pain with tuning fork to calcaneus  bilateral. No pain with calf compression bilateral. There is decreased Ankle joint range of motion bilateral. All other joints range of motion within normal limits bilateral.  Pes planus foot type.  Strength 5/5 in all groups bilateral.   Gait: Unassisted, Antalgic avoid weight on heels  Xray, Right/Left foot:  Normal osseous mineralization. Joint spaces preserved except midtarsal joint where there is breech supportive of pes planus deformity. No fracture/dislocation/boney destruction. Calcaneal spur present with mild thickening of plantar fascia. No other soft tissue abnormalities or radiopaque foreign bodies.   Assessment and Plan: Problem List Items Addressed This Visit    None    Visit Diagnoses    Foot pain, bilateral    -  Primary   Relevant Orders   DG Foot Complete Right   DG Foot Complete Left   Plantar fasciitis, bilateral       Pes planus of both feet          -Complete examination performed.  -Xrays reviewed -Discussed with patient in detail the condition of plantar fasciitis, how this occurs and general treatment options. Explained both conservative and surgical treatments.  -No injection given at this time will reconsider next visit if fails to continue to improve with oral medication as below -Rx Meloxicam to start after prednisone dose pack is completed -Recommended good supportive shoes and advised use of heel lifts as dispensed this visit -Explained and dispensed to patient daily stretching exercises. -Recommend patient to ice affected area 1-2x daily. -Patient to return to  office in 3-4 weeks for follow up or sooner if problems or questions arise.  Asencion Islam, DPM

## 2020-01-09 NOTE — Patient Instructions (Signed)
For tennis shoes recommend:  Anne Shutter Ascis New balance Saucony Can be purchased at [employer] sports or [employer] arch fit Can be purchased at any major retailers  Vionic  SAS Can be purchased at [employer] or [employer]   For work shoes recommend: [employer] Can be purchased at a variety of places or Scientist, product/process development   For casual shoes recommend: Vionic  Can be purchased at [employer] or [employer]   For Over the [employer] recommend: Power Steps Can be purchased in office/[facility] [employer] Can be purchased at [employer] sports or [employer] Can be purchased at [employer]    Plantar Fasciitis Rehab Ask your health care provider which exercises are safe for you. Do exercises exactly as told by your health care provider and adjust them as directed. It is normal to feel mild stretching, pulling, tightness, or discomfort as you do these exercises. Stop right away if you feel sudden pain or your pain gets worse. Do not begin these exercises until told by your health care provider. Stretching and range-of-motion exercises These exercises warm up your muscles and joints and improve the movement and flexibility of your foot. These exercises also help to relieve pain. Plantar fascia stretch  1. Sit with your left / right leg crossed over your opposite knee. 2. Hold your heel with one hand with that thumb near your arch. With your other hand, hold your toes and gently pull them back toward the top of your foot. You should feel a stretch on the bottom of your toes or your foot (plantar fascia) or both. 3. Hold this stretch for__________ seconds. 4. Slowly release your toes and return to the starting position. Repeat __________ times. Complete this exercise __________ times a day. Gastrocnemius stretch, standing This exercise is also called a calf (gastroc) stretch. It stretches the muscles in the back of the upper calf. 1. Stand  with your hands against a wall. 2. Extend your left / right leg behind you, and bend your front knee slightly. 3. Keeping your heels on the floor and your back knee straight, shift your weight toward the wall. Do not arch your back. You should feel a gentle stretch in your upper left / right calf. 4. Hold this position for __________ seconds. Repeat __________ times. Complete this exercise __________ times a day. Soleus stretch, standing This exercise is also called a calf (soleus) stretch. It stretches the muscles in the back of the lower calf. 1. Stand with your hands against a wall. 2. Extend your left / right leg behind you, and bend your front knee slightly. 3. Keeping your heels on the floor, bend your back knee and shift your weight slightly over your back leg. You should feel a gentle stretch deep in your lower calf. 4. Hold this position for __________ seconds. Repeat __________ times. Complete this exercise __________ times a day. Gastroc and soleus stretch, standing step This exercise stretches the muscles in the back of the lower leg. These muscles are in the upper calf (gastrocnemius) and the lower calf (soleus). 1. Stand with the ball of your left / right foot on a step. The ball of your foot is on the walking surface, right under your toes. 2. Keep your other foot firmly on the same step. 3. Hold on to the wall or a railing for balance. 4. Slowly lift your other foot, allowing your body weight to press your left / right heel down over  the edge of the step. You should feel a stretch in your left / right calf. 5. Hold this position for __________ seconds. 6. Return both feet to the step. 7. Repeat this exercise with a slight bend in your left / right knee. Repeat __________ times with your left / right knee straight and __________ times with your left / right knee bent. Complete this exercise __________ times a day. Balance exercise This exercise builds your balance and strength  control of your arch to help take pressure off your plantar fascia. Single leg stand If this exercise is too easy, you can try it with your eyes closed or while standing on a pillow. 1. Without shoes, stand near a railing or in a doorway. You may hold on to the railing or door frame as needed. 2. Stand on your left / right foot. Keep your big toe down on the floor and try to keep your arch lifted. Do not let your foot roll inward. 3. Hold this position for __________ seconds. Repeat __________ times. Complete this exercise __________ times a day. This information is not intended to replace advice given to you by your health care provider. Make sure you discuss any questions you have with your health care provider. Document Revised: 09/22/2018 Document Reviewed: 03/30/2018 Elsevier Patient Education  2020 [employer].

## 2020-02-08 ENCOUNTER — Ambulatory Visit: Payer: Medicaid Other | Admitting: Sports Medicine

## 2020-02-15 ENCOUNTER — Encounter: Payer: Self-pay | Admitting: Sports Medicine

## 2020-02-15 ENCOUNTER — Other Ambulatory Visit: Payer: Self-pay

## 2020-02-15 ENCOUNTER — Ambulatory Visit: Payer: Medicaid Other | Admitting: Sports Medicine

## 2020-02-15 DIAGNOSIS — M2141 Flat foot [pes planus] (acquired), right foot: Secondary | ICD-10-CM

## 2020-02-15 DIAGNOSIS — M79671 Pain in right foot: Secondary | ICD-10-CM | POA: Diagnosis not present

## 2020-02-15 DIAGNOSIS — M722 Plantar fascial fibromatosis: Secondary | ICD-10-CM | POA: Diagnosis not present

## 2020-02-15 DIAGNOSIS — M79672 Pain in left foot: Secondary | ICD-10-CM | POA: Diagnosis not present

## 2020-02-15 DIAGNOSIS — M2142 Flat foot [pes planus] (acquired), left foot: Secondary | ICD-10-CM

## 2020-02-15 NOTE — Progress Notes (Signed)
Subjective: Brooke Lewis is a 44 y.o. female patient returns to office for follow-up evaluation of bilateral foot pain.  Patient reports that her feet feel much better.  1 out of 10.. Reports that she has completed medication and has been doing gentle stretching and icing as instructed without any recurrent symptoms or worsening.  Patient denies any other acute symptoms at this time.  Patient Active Problem List   Diagnosis Date Noted  . Active labor 06/22/2013  . Sterilization 06/22/2013  . Normal delivery 09/05/2011    Current Outpatient Medications on File Prior to Visit  Medication Sig Dispense Refill  . fluconazole (DIFLUCAN) 150 MG tablet TAKE 1 TABLET BY MOUTH AS A SINGLE DOSE 1 tablet 0  . meloxicam (MOBIC) 15 MG tablet Take 1 tablet (15 mg total) by mouth daily. After finishing steriod 30 tablet 0  . metroNIDAZOLE (METROGEL) 0.75 % vaginal gel INSERT 1 APPLICATORFUL VAGINALLY TWICE DAILY 70 g 5  . predniSONE (STERAPRED UNI-PAK 21 TAB) 10 MG (21) TBPK tablet Take as directed 21 tablet 0  . SOLOSEC 2 g PACK TAKE 1 PACKET BY MOUTH ONCE FOR 1 DOSE 1 each 2   No current facility-administered medications on file prior to visit.    Allergies  Allergen Reactions  . Shellfish Allergy Nausea And Vomiting    Objective: Physical Exam General: The patient is alert and oriented x3 in no acute distress.  Dermatology: Skin is warm, dry and supple bilateral lower extremities. Nails 1-10 are normal. There is no erythema, edema, no eccymosis, no open lesions present. Integument is otherwise unremarkable.  Vascular: Dorsalis Pedis pulse and Posterior Tibial pulse are 2/4 bilateral. Capillary fill time is immediate to all digits.  Neurological: Grossly intact to light touch bilateral.  Musculoskeletal: No reproducible tenderness to palpation at the medial calcaneal tubercale and through the insertion of the plantar fascia on the left and right foot. No pain with compression of calcaneus  bilateral.  No pain to calf.  There is decreased Ankle joint range of motion bilateral. All other joints range of motion within normal limits bilateral.  Pes planus foot type.  Strength 5/5 in all groups bilateral.   Assessment and Plan: Problem List Items Addressed This Visit    None    Visit Diagnoses    Plantar fasciitis, bilateral    -  Primary   Foot pain, bilateral       Pes planus of both feet         -Complete examination performed.  -Discussed with patient long-term care plantar fasciitis -Advised patient to continue with gentle stretching and good supportive shoes -Offered patient custom orthotics however due to insurance not covering them did discuss with her getting super feet orthotics office staff will call her to let her know lack of coverage -Advised patient to call office if her symptoms slowly start to recur for a refill on her meloxicam -Patient to return to office as needed or sooner if problems or questions arise.  Asencion Islam, DPM

## 2020-02-16 ENCOUNTER — Telehealth: Payer: Self-pay

## 2020-02-16 NOTE — Telephone Encounter (Signed)
-----   Message from Asencion Islam, [place] sent at 02/15/2020  6:43 PM EDT ----- Regarding: Orthotics Will you let patient know that orthotics are not covered by her insurance if she wants to invest in getting good supportive insoles for her shoes she should get super feet orthotics from [employer] [facility] or [employer] in [place] Thanks Dr. Kathie Rhodes

## 2020-02-16 NOTE — Telephone Encounter (Signed)
Pt was notified that her orthotics were not covered by insurance. Pt was advised of Dr. Wynema Birch instructions

## 2020-05-12 ENCOUNTER — Other Ambulatory Visit: Payer: Self-pay | Admitting: Obstetrics

## 2020-05-12 DIAGNOSIS — N760 Acute vaginitis: Secondary | ICD-10-CM

## 2020-12-03 ENCOUNTER — Other Ambulatory Visit: Payer: Self-pay | Admitting: Obstetrics

## 2020-12-03 DIAGNOSIS — B9689 Other specified bacterial agents as the cause of diseases classified elsewhere: Secondary | ICD-10-CM

## 2020-12-20 ENCOUNTER — Telehealth: Payer: Self-pay | Admitting: Sports Medicine

## 2020-12-20 NOTE — Telephone Encounter (Signed)
Patient is calling and stating that she is still in severe pain with her foot and that she would like to start the process of getting disability since its keeping her from working. Please Advise.

## 2021-03-16 ENCOUNTER — Other Ambulatory Visit: Payer: Self-pay | Admitting: Obstetrics

## 2021-03-16 DIAGNOSIS — B9689 Other specified bacterial agents as the cause of diseases classified elsewhere: Secondary | ICD-10-CM

## 2021-03-19 ENCOUNTER — Telehealth: Payer: Self-pay

## 2021-03-19 ENCOUNTER — Other Ambulatory Visit: Payer: Self-pay

## 2021-03-19 DIAGNOSIS — B9689 Other specified bacterial agents as the cause of diseases classified elsewhere: Secondary | ICD-10-CM

## 2021-03-19 NOTE — Telephone Encounter (Signed)
Patient called requesting a refill for metrogel. I discussed with patient that she will need to be seen before receiving additional refills. Her last prescription was sent in 11/2020 with 5 refills. Explained to patient that is has not even been 5 months since that prescription was sent. Informed patient that it is best for her to be seen to rule out an other infection. Patient transferred to front desk to schedule appointment.

## 2021-03-19 NOTE — Telephone Encounter (Signed)
Refill for metrogel denied. See TE 03/19/21

## 2021-03-22 ENCOUNTER — Other Ambulatory Visit: Payer: Self-pay | Admitting: Obstetrics

## 2021-03-22 DIAGNOSIS — B9689 Other specified bacterial agents as the cause of diseases classified elsewhere: Secondary | ICD-10-CM

## 2021-03-26 ENCOUNTER — Ambulatory Visit: Payer: Medicaid Other | Admitting: Obstetrics

## 2021-11-28 ENCOUNTER — Other Ambulatory Visit: Payer: Self-pay

## 2021-11-28 ENCOUNTER — Emergency Department (HOSPITAL_COMMUNITY)
Admission: EM | Admit: 2021-11-28 | Discharge: 2021-11-28 | Disposition: A | Discharge status: Home / Self Care | Payer: Medicaid Other | Attending: Emergency Medicine | Admitting: Emergency Medicine

## 2021-11-28 ENCOUNTER — Encounter (HOSPITAL_COMMUNITY): Payer: Self-pay

## 2021-11-28 DIAGNOSIS — E119 Type 2 diabetes mellitus without complications: Secondary | ICD-10-CM | POA: Insufficient documentation

## 2021-11-28 DIAGNOSIS — Z7984 Long term (current) use of oral hypoglycemic drugs: Secondary | ICD-10-CM | POA: Diagnosis not present

## 2021-11-28 DIAGNOSIS — R109 Unspecified abdominal pain: Secondary | ICD-10-CM

## 2021-11-28 DIAGNOSIS — R7309 Other abnormal glucose: Secondary | ICD-10-CM

## 2021-11-28 LAB — COMPREHENSIVE METABOLIC PANEL
ALT: 23 U/L (ref 0–44)
AST: 18 U/L (ref 15–41)
Albumin: 3.9 g/dL (ref 3.5–5.0)
Alkaline Phosphatase: 107 U/L (ref 38–126)
Anion gap: 7 (ref 5–15)
BUN: 7 mg/dL (ref 6–20)
CO2: 28 mmol/L (ref 22–32)
Calcium: 9.2 mg/dL (ref 8.9–10.3)
Chloride: 101 mmol/L (ref 98–111)
Creatinine, Ser: 0.75 mg/dL (ref 0.44–1.00)
GFR, Estimated: >60 mL/min (ref >60)
Glucose, Bld: 258 mg/dL — ABNORMAL HIGH (ref 70–99)
Potassium: 3.7 mmol/L (ref 3.5–5.1)
Sodium: 136 mmol/L (ref 135–145)
Total Bilirubin: 0.7 mg/dL (ref 0.3–1.2)
Total Protein: 7.7 g/dL (ref 6.5–8.1)

## 2021-11-28 LAB — CBC
HCT: 38.9 % (ref 36.0–46.0)
Hemoglobin: 11.6 g/dL — ABNORMAL LOW (ref 12.0–15.0)
MCH: 21.2 pg — ABNORMAL LOW (ref 26.0–34.0)
MCHC: 29.8 g/dL — ABNORMAL LOW (ref 30.0–36.0)
MCV: 71 fL — ABNORMAL LOW (ref 80.0–100.0)
Platelets: 522 10*3/uL — ABNORMAL HIGH (ref 150–400)
RBC: 5.48 MIL/uL — ABNORMAL HIGH (ref 3.87–5.11)
RDW: 18.1 % — ABNORMAL HIGH (ref 11.5–15.5)
WBC: 6.1 10*3/uL (ref 4.0–10.5)
nRBC: 0 % (ref 0.0–0.2)

## 2021-11-28 LAB — LIPASE, BLOOD: Lipase: 36 U/L (ref 11–51)

## 2021-11-28 LAB — URINALYSIS, ROUTINE W REFLEX MICROSCOPIC
Bilirubin Urine: NEGATIVE
Glucose, UA: >=500 mg/dL — AB
Hgb urine dipstick: NEGATIVE
Ketones, ur: 20 mg/dL — AB
Nitrite: NEGATIVE
Protein, ur: NEGATIVE mg/dL
Specific Gravity, Urine: 1.023 (ref 1.005–1.030)
WBC, UA: >50 WBC/hpf — ABNORMAL HIGH (ref 0–5)
pH: 5 (ref 5.0–8.0)

## 2021-11-28 LAB — HEMOGLOBIN A1C
Hgb A1c MFr Bld: 11.4 % — ABNORMAL HIGH (ref 4.8–5.6)
Mean Plasma Glucose: 280.48 mg/dL

## 2021-11-28 LAB — I-STAT BETA HCG BLOOD, ED (MC, WL, AP ONLY): I-stat hCG, quantitative: <5 m[IU]/mL (ref <5)

## 2021-11-28 LAB — CBG MONITORING, ED: Glucose-Capillary: 239 mg/dL — ABNORMAL HIGH (ref 70–99)

## 2021-11-28 MED ORDER — METFORMIN HCL 500 MG PO TABS: 500.0000 mg | ORAL_TABLET | Freq: Every day | ORAL | 0 refills | Status: AC

## 2021-11-28 MED ORDER — SODIUM CHLORIDE 0.9 % IV BOLUS
1000.0000 mL | Freq: Once | INTRAVENOUS | Status: AC
  Administered 2021-11-28: 1000 mL via INTRAVENOUS

## 2021-11-28 NOTE — ED Provider Triage Note (Signed)
Emergency Medicine Provider Triage Evaluation Note  Jahira Swiss , a 46 y.o. female  was evaluated in triage.  Pt complains of suprapubic abdominal cramping onset 3 months.  Patient was evaluated in urgent care and sent here due to having glucose found in the urine dipstick.  Denies past medical history of diabetes.  No meds tried prior to arrival.  Has associated increased thirst. Denies urinary symptoms, nausea, vomiting.   Review of Systems  Positive: As per HPI above Negative:   Physical Exam  BP 106/75 (BP Location: Right Arm)   Pulse 87   Temp 98.2 F (36.8 C) (Oral)   Resp 16   SpO2 95%  Gen:   Awake, no distress   Resp:  Normal effort  MSK:   Moves extremities without difficulty  Other:  No abdominal TTP.  Medical Decision Making  Medically screening exam initiated at 12:27 PM.  Appropriate orders placed.  Yuliana Vandrunen was informed that the remainder of the evaluation will be completed by another provider, this initial triage assessment does not replace that evaluation, and the importance of remaining in the ED until their evaluation is complete.  Work-up initiated   Deonne Rooks A, PA-C 11/28/21 1237

## 2021-11-28 NOTE — ED Provider Notes (Signed)
[facility] EMERGENCY DEPARTMENT Provider Note   CSN: [id number] Arrival date & time: 11/28/21  1136     History  Chief Complaint  Patient presents with   Abdominal Pain    Brooke Lewis is a 46 y.o. female who presents emergency department complaining of abdominal cramping for 3 months.  Patient initially went to urgent care earlier today, and was sent to the ER for evaluation after finding glucose on her urine dipstick.  She denies past medical history of diabetes.  Has noted associated increased thirst.  No urinary symptoms, nausea or vomiting.   Abdominal Pain Associated symptoms: no dysuria, no fever, no nausea and no vomiting        Home Medications Prior to Admission medications   Medication Sig Start Date End Date Taking? Authorizing Provider  metFORMIN (GLUCOPHAGE) 500 MG tablet Take 1 tablet (500 mg total) by mouth daily with breakfast. 11/28/21  Yes Sheryl Towell T, PA-C  metroNIDAZOLE (METROGEL) 0.75 % vaginal gel INSERT 1 APPLICATORFUL VAGINALLY TWICE DAILY Patient taking differently: Place 1 Applicatorful vaginally daily. 03/25/21  Yes Brock Bad, MD  fluconazole (DIFLUCAN) 150 MG tablet TAKE 1 TABLET BY MOUTH AS A SINGLE DOSE Patient not taking: Reported on 11/28/2021 12/31/18   Brock Bad, MD  meloxicam (MOBIC) 15 MG tablet Take 1 tablet (15 mg total) by mouth daily. After finishing steriod Patient not taking: Reported on 11/28/2021 01/09/20   Asencion Islam, DPM  predniSONE (STERAPRED UNI-PAK 21 TAB) 10 MG (21) TBPK tablet Take as directed Patient not taking: Reported on 11/28/2021 01/09/20   Asencion Islam, DPM      Allergies    Shellfish allergy    Review of Systems   Review of Systems  Constitutional:  Negative for fever.  Gastrointestinal:  Positive for abdominal pain. Negative for nausea and vomiting.  Endocrine: Positive for polydipsia.  Genitourinary:  Negative for dysuria, frequency and urgency.  All other systems  reviewed and are negative.   Physical Exam Updated Vital Signs BP 105/71   Pulse 78   Temp 97.7 F (36.5 C)   Resp 16   SpO2 97%  Physical Exam Vitals and nursing note reviewed.  Constitutional:      Appearance: Normal appearance.  HENT:     Head: Normocephalic and atraumatic.  Eyes:     Conjunctiva/sclera: Conjunctivae normal.  Cardiovascular:     Rate and Rhythm: Normal rate and regular rhythm.  Pulmonary:     Effort: Pulmonary effort is normal. No respiratory distress.     Breath sounds: Normal breath sounds.  Abdominal:     General: There is no distension.     Palpations: Abdomen is soft.     Tenderness: There is no abdominal tenderness.  Skin:    General: Skin is warm and dry.  Neurological:     General: No focal deficit present.     Mental Status: She is alert.     ED Results / Procedures / Treatments   Labs (all labs ordered are listed, but only abnormal results are displayed) Labs Reviewed  COMPREHENSIVE METABOLIC PANEL - Abnormal; Notable for the following components:      Result Value   Glucose, Bld 258 (*)    All other components within normal limits  CBC - Abnormal; Notable for the following components:   RBC 5.48 (*)    Hemoglobin 11.6 (*)    MCV 71.0 (*)    MCH 21.2 (*)    MCHC 29.8 (*)  RDW 18.1 (*)    Platelets 522 (*)    All other components within normal limits  URINALYSIS, ROUTINE W REFLEX MICROSCOPIC - Abnormal; Notable for the following components:   APPearance HAZY (*)    Glucose, UA >=500 (*)    Ketones, ur 20 (*)    Leukocytes,Ua MODERATE (*)    WBC, UA >50 (*)    Bacteria, UA MANY (*)    All other components within normal limits  HEMOGLOBIN A1C - Abnormal; Notable for the following components:   Hgb A1c MFr Bld 11.4 (*)    All other components within normal limits  CBG MONITORING, ED - Abnormal; Notable for the following components:   Glucose-Capillary 239 (*)    All other components within normal limits  LIPASE, BLOOD   I-STAT BETA HCG BLOOD, ED ([facility], [facility], [facility] ONLY)    EKG None  Radiology No results found.  Procedures Procedures    Medications Ordered in ED Medications  sodium chloride 0.9 % bolus 1,000 mL (1,000 mLs Intravenous New Bag/Given 11/28/21 1539)    ED Course/ Medical Decision Making/ A&P                           Medical Decision Making Amount and/or Complexity of Data Reviewed Labs: ordered.  This patient is a 46 y.o. female  who presents to the ED for concern of abdominal cramping and glucose in urine.   Differential diagnoses prior to evaluation: The emergent differential diagnosis includes, but is not limited to,  appendicitis, diverticulitis, DKA, gastritis/gastroenteritis, nephrolithiasis, pancreatitis, constipation, UTI, bowel obstruction, biliary disease, IBD, PUD, hepatitis, ectopic pregnancy, ovarian torsion, PID. This is not an exhaustive differential.   Past Medical History / Co-morbidities: No significant PMH, no hx of diabetes, family hx of diabetes  Physical Exam: Physical exam performed. The pertinent findings include: Normal vital signs. Abdomen soft, non-tender.   Lab Tests/Imaging studies: I personally interpreted labs/imaging and the pertinent results include:  Glucose 258, urinalysis with > 500 glucose, negative for infection. No leukocytosis. Normal kidney function. A1c 11.4.   Medications: I ordered medication including IV fluids.  I have reviewed the patients home medicines and have made adjustments as needed.   Disposition: After consideration of the diagnostic results and the patients response to treatment, I feel that is not requiring admission.  She is not in DKA.  States that she feels much better after a liter of IV fluids.  Due to her elevated A1c, will go ahead and prescribe her on once daily metformin and have her follow-up with her primary care doctor. Discussed reasons to return to the emergency department, and the patient is agreeable to the  plan.   Final Clinical Impression(s) / ED Diagnoses Final diagnoses:  Abdominal cramping  Elevated hemoglobin A1c  New onset type 2 diabetes mellitus (HCC)    Rx / DC Orders ED Discharge Orders          Ordered    metFORMIN (GLUCOPHAGE) 500 MG tablet  Daily with breakfast        11/28/21 1655           Portions of this report may have been transcribed using voice recognition software. Every effort was made to ensure accuracy; however, inadvertent computerized transcription errors may be present.    Jeanella Flattery 11/28/21 1706    Mancel Bale, MD 11/29/21 [phone]

## 2021-11-28 NOTE — ED Notes (Signed)
Pt verbalizes understanding of discharge instructions. Opportunity for questions and answers were provided. Pt discharged from the ED.   ?

## 2021-11-28 NOTE — ED Triage Notes (Signed)
Patient reports left UC and sent here for abd cramp and due to having glucose in urine.  3 months of abd cramp.

## 2021-11-28 NOTE — Discharge Instructions (Addendum)
You were seen in the emergency department today for abdominal cramping and glucose in your urine.  Your blood sugar level was elevated, and the test that we get that evaluates your average blood sugars for the past 3 months was significantly elevated.  I think that is worth it to go ahead and start you on a low level of medication for this.    I am starting on a medicine called metformin, you will take one 500 mg tablet once daily with breakfast.  I am writing a prescription for 1 month, in hopes that you can see your primary doctor within that time.

## 2021-12-26 ENCOUNTER — Ambulatory Visit
Admission: RE | Admit: 2021-12-26 | Discharge: 2021-12-26 | Disposition: A | Discharge status: Home / Self Care | Payer: Medicaid Other | Source: Ambulatory Visit | Attending: Student | Admitting: Student

## 2021-12-26 VITALS — BP 114/79 | HR 79 | Temp 98.2°F | Resp 18

## 2021-12-26 DIAGNOSIS — L259 Unspecified contact dermatitis, unspecified cause: Secondary | ICD-10-CM

## 2021-12-26 DIAGNOSIS — E119 Type 2 diabetes mellitus without complications: Secondary | ICD-10-CM

## 2021-12-26 MED ORDER — HYDROCORTISONE 2.5 % EX LOTN: TOPICAL_LOTION | Freq: Two times a day (BID) | CUTANEOUS | 0 refills | Status: AC

## 2021-12-26 MED ORDER — HYDROXYZINE HCL 25 MG PO TABS: 25.0000 mg | ORAL_TABLET | Freq: Four times a day (QID) | ORAL | 0 refills | Status: AC

## 2021-12-26 NOTE — ED Triage Notes (Signed)
Pt present rash on both arms, pt states the rash is itching and developed three ago.

## 2021-12-26 NOTE — ED Provider Notes (Signed)
[facility]    CSN: [id number] Arrival date & time: 12/26/21  1029      History   Chief Complaint Chief Complaint  Patient presents with   Allergic Reaction    I am a diabetic and I am noticing a lot of bumps on my right arm and they are coming up in other areas of my body - Entered by patient   Rash    HPI Brooke Lewis is a 46 y.o. female presenting with pruritic rash bilateral forearms x3 days following yardwork. States rash seems to be spreading up arms. Denies facial sweling, SOB, CP. Hasn't attempted any interventions at home.  HPI  Past Medical History:  Diagnosis Date   Medical history non-contributory    No pertinent past medical history     Patient Active Problem List   Diagnosis Date Noted   Active labor 06/22/2013   Sterilization 06/22/2013   Normal delivery 09/05/2011    Past Surgical History:  Procedure Laterality Date   NO PAST SURGERIES     TUBAL LIGATION N/A 06/22/2013   Procedure: POST PARTUM TUBAL LIGATION;  Surgeon: Willodean Rosenthal, MD;  Location: [facility];  Service: Gynecology;  Laterality: N/A;    OB History     Gravida  4   Para  4   Term  4   Preterm      AB      Living  4      SAB      IAB      Ectopic      Multiple      Live Births  4            Home Medications    Prior to Admission medications   Medication Sig Start Date End Date Taking? Authorizing Provider  hydrocortisone 2.5 % lotion Apply topically 2 (two) times daily for 7 days. 12/26/21 01/02/22 Yes Rhys Martini, PA-C  hydrOXYzine (ATARAX) 25 MG tablet Take 1 tablet (25 mg total) by mouth every 6 (six) hours. 12/26/21  Yes Rhys Martini, PA-C  metFORMIN (GLUCOPHAGE) 500 MG tablet Take 1 tablet (500 mg total) by mouth daily with breakfast. 11/28/21   Roemhildt, Lorin T, PA-C  metroNIDAZOLE (METROGEL) 0.75 % vaginal gel INSERT 1 APPLICATORFUL VAGINALLY TWICE DAILY Patient taking differently: Place 1 Applicatorful vaginally daily. 03/25/21    Brock Bad, MD    Family History Family History  Problem Relation Age of Onset   Diabetes Mother    Hypertension Mother    Diabetes Maternal Grandmother    Cancer Neg Hx     Social History Social History   Tobacco Use   Smoking status: Never   Smokeless tobacco: Never  Vaping Use   Vaping Use: Never used  Substance Use Topics   Alcohol use: No    Alcohol/week: 0.0 standard drinks of alcohol   Drug use: No     Allergies   Shellfish allergy   Review of Systems Review of Systems  Skin:  Positive for rash.  All other systems reviewed and are negative.    Physical Exam Triage Vital Signs ED Triage Vitals  Enc Vitals Group     BP 12/26/21 1107 114/79     Pulse Rate 12/26/21 1107 79     Resp 12/26/21 1107 18     Temp 12/26/21 1107 98.2 F (36.8 C)     Temp Source 12/26/21 1107 Oral     SpO2 12/26/21 1107 96 %  Weight --      Height --      Head Circumference --      Peak Flow --      Pain Score 12/26/21 1106 0     Pain Loc --      Pain Edu? --      Excl. in GC? --    No data found.  Updated Vital Signs BP 114/79 (BP Location: Left Arm)   Pulse 79   Temp 98.2 F (36.8 C) (Oral)   Resp 18   SpO2 96%   Visual Acuity Right Eye Distance:   Left Eye Distance:   Bilateral Distance:    Right Eye Near:   Left Eye Near:    Bilateral Near:     Physical Exam Vitals reviewed.  Constitutional:      General: She is not in acute distress.    Appearance: Normal appearance. She is not ill-appearing.  HENT:     Head: Normocephalic and atraumatic.     Mouth/Throat:     Comments: No facial, lip, tongue, uvula swelling.  Pulmonary:     Effort: Pulmonary effort is normal.  Skin:    Findings: Rash present. Rash is urticarial.     Comments: Urticarial patches forearms. No facial involvement.  Neurological:     General: No focal deficit present.     Mental Status: She is alert and oriented to person, place, and time.  Psychiatric:        Mood  and Affect: Mood normal.        Behavior: Behavior normal.        Thought Content: Thought content normal.        Judgment: Judgment normal.      UC Treatments / Results  Labs (all labs ordered are listed, but only abnormal results are displayed) Labs Reviewed - No data to display  EKG   Radiology No results found.  Procedures Procedures (including critical care time)  Medications Ordered in UC Medications - No data to display  Initial Impression / Assessment and Plan / UC Course  I have reviewed the triage vital signs and the nursing notes.  Pertinent labs & imaging results that were available during my care of the patient were reviewed by me and considered in my medical decision making (see chart for details).     This patient is a very pleasant 46 y.o. year old female presenting with urticarial contact dermatitis  - forearms. Rash is limited to forearms so will proceed with hydrocortisone and hydroxyzine. Sugars running 90-125 fasting, continue current regimen. ED return precautions discussed. Patient verbalizes understanding and agreement.   Final Clinical Impressions(s) / UC Diagnoses   Final diagnoses:  Contact dermatitis, unspecified contact dermatitis type, unspecified trigger  Diabetes mellitus treated with oral medication (HCC)     Discharge Instructions      -Hydrocortisone lotion twice daily x7 days or while symptoms persist  -Hydroxyzine as needed for itching, up to every 6 hours. This medication will make you drowsy, so avoid before driving or operating machinery. Do not drink alcohol while taking this medication.  -Avoid other antihistamines that will make you drowsy while taking this medication, like benedryl.    ED Prescriptions     Medication Sig Dispense Auth. Provider   hydrOXYzine (ATARAX) 25 MG tablet Take 1 tablet (25 mg total) by mouth every 6 (six) hours. 12 tablet Rhys Martini, PA-C   hydrocortisone 2.5 % lotion Apply topically 2  (two) times daily for  7 days. 59 mL Rhys Martini, PA-C      PDMP not reviewed this encounter.   Rhys Martini, PA-C 12/26/21 1201

## 2021-12-26 NOTE — Discharge Instructions (Addendum)
-  Hydrocortisone lotion twice daily x7 days or while symptoms persist  -Hydroxyzine as needed for itching, up to every 6 hours. This medication will make you drowsy, so avoid before driving or operating machinery. Do not drink alcohol while taking this medication.  -Avoid other antihistamines that will make you drowsy while taking this medication, like benedryl.

## 2022-03-25 ENCOUNTER — Other Ambulatory Visit: Payer: Self-pay | Admitting: Obstetrics

## 2022-03-25 DIAGNOSIS — B9689 Other specified bacterial agents as the cause of diseases classified elsewhere: Secondary | ICD-10-CM

## 2022-03-26 ENCOUNTER — Other Ambulatory Visit: Payer: Self-pay | Admitting: Obstetrics

## 2022-03-26 DIAGNOSIS — N760 Acute vaginitis: Secondary | ICD-10-CM

## 2023-02-22 ENCOUNTER — Ambulatory Visit (INDEPENDENT_AMBULATORY_CARE_PROVIDER_SITE_OTHER): Payer: Medicaid Other | Admitting: Podiatry

## 2023-02-22 DIAGNOSIS — B351 Tinea unguium: Secondary | ICD-10-CM | POA: Diagnosis not present

## 2023-02-22 DIAGNOSIS — M79676 Pain in unspecified toe(s): Secondary | ICD-10-CM

## 2023-02-22 NOTE — Progress Notes (Unsigned)
    Subjective:  Patient ID: Brooke Lewis, female    DOB: Oct 24, 1975,  MRN: [id number]  Brooke Lewis presents to clinic today for:  Chief Complaint  Patient presents with   Diabetes    Pt presents for Diabetic Foot Evaluation and Care    Patient notes nails are thick, discolored, elongated and painful in shoegear when trying to ambulate.    Past Medical History:  Diagnosis Date   Medical history non-contributory    No pertinent past medical history    Past Surgical History:  Procedure Laterality Date   NO PAST SURGERIES     TUBAL LIGATION N/A 06/22/2013   Procedure: POST PARTUM TUBAL LIGATION;  Surgeon: Willodean Rosenthal, MD;  Location: [facility];  Service: Gynecology;  Laterality: N/A;   Allergies  Allergen Reactions   Shellfish Allergy Nausea And Vomiting   Review of Systems: Negative except as noted in the HPI.  Objective:  Brooke Lewis is a pleasant 47 y.o. female in NAD. AAO x 3.  Vascular Examination: Capillary refill time is 3-5 seconds to toes bilateral. Palpable pedal pulses b/l LE. Digital hair present b/l.  Skin temperature gradient WNL b/l. No varicosities b/l. No cyanosis noted b/l.   Dermatological Examination: Pedal skin with normal turgor, texture and tone b/l. No open wounds. No interdigital macerations b/l. Toenails x10 are 3mm thick, discolored, dystrophic with subungual debris. There is pain with compression of the nail plates.  They are elongated x10.  Skin slightly dry on the plantar aspect of the feet.  Neurological Examination: Mild decrease in temperature and vibratory sensation but protective sensation is intact.   Assessment/Plan: 1. Dermatophytosis of nail    The mycotic toenails were sharply debrided x10 with sterile nail nippers and a power debriding burr to decrease bulk/thickness and length.    Recommended Goldbond and Eucerin cream for diabetics.  Follow-up 3 months  Return in about 3 months (around 05/24/2023) for [facility].  Clerance Lav,  DPM, FACFAS [facility]     [address] [place], [place] [address]                Office [phone]  Fax [phone]

## 2023-05-24 ENCOUNTER — Encounter: Payer: Medicaid Other | Admitting: Podiatry

## 2023-05-24 NOTE — Progress Notes (Signed)
Patient was a no show for her scheduled visit this morning.

## 2023-12-01 ENCOUNTER — Ambulatory Visit: Admitting: Obstetrics

## 2024-01-13 ENCOUNTER — Encounter: Admitting: Obstetrics and Gynecology

## 2024-03-01 ENCOUNTER — Ambulatory Visit (HOSPITAL_COMMUNITY)
Admission: EM | Admit: 2024-03-01 | Discharge: 2024-03-01 | Disposition: A | Discharge status: Home / Self Care | Attending: Emergency Medicine | Admitting: Emergency Medicine

## 2024-03-01 ENCOUNTER — Encounter (HOSPITAL_COMMUNITY): Payer: Self-pay

## 2024-03-01 DIAGNOSIS — S70362A Insect bite (nonvenomous), left thigh, initial encounter: Secondary | ICD-10-CM

## 2024-03-01 DIAGNOSIS — W57XXXA Bitten or stung by nonvenomous insect and other nonvenomous arthropods, initial encounter: Secondary | ICD-10-CM

## 2024-03-01 DIAGNOSIS — L239 Allergic contact dermatitis, unspecified cause: Secondary | ICD-10-CM | POA: Diagnosis not present

## 2024-03-01 MED ORDER — DIPHENHYDRAMINE HCL 25 MG PO TABS: 25.0000 mg | ORAL_TABLET | Freq: Four times a day (QID) | ORAL | 0 refills | Status: AC | PRN

## 2024-03-01 MED ORDER — DEXAMETHASONE SODIUM PHOSPHATE 10 MG/ML IJ SOLN
INTRAMUSCULAR | Status: AC
  Filled 2024-03-01: qty 1

## 2024-03-01 MED ORDER — DEXAMETHASONE SODIUM PHOSPHATE 10 MG/ML IJ SOLN
10.0000 mg | Freq: Once | INTRAMUSCULAR | Status: AC
Start: 2024-03-01 — End: 2024-03-01
  Administered 2024-03-01: 10 mg via INTRAMUSCULAR

## 2024-03-01 NOTE — ED Provider Notes (Signed)
 [facility]    CSN: [id number] Arrival date & time: 03/01/24  0841      History   Chief Complaint Chief Complaint  Patient presents with   Insect Bite    HPI Brooke Lewis is a 48 y.o. female.   Patient presents to clinic over concern of an erythematous urticarial spot to her left posterior thigh.  2 days ago she was in the car and felt a stabbing or biting sensation to the back of her thigh.  She was wearing shorts while driving and thinks something may have bit her, maybe a bug, this was not visualized.  Since then she has been using topical Benadryl  and cortisone cream for the itching, which helps.  Came into clinic today because she is concerned because the area is about the size of her palm and urticarial.  Has not had any drainage from the area.  Afebrile.  History of type 2 diabetes, hemoglobin A1c 11.3 in June 2023.  Reports her diabetes is well-controlled on oral metformin , followed by PCP.  Her last A1c may have been in the sixes, she is unsure.  The history is provided by the patient and medical records.    Past Medical History:  Diagnosis Date   Medical history non-contributory    No pertinent past medical history     Patient Active Problem List   Diagnosis Date Noted   Active labor 06/22/2013   Encounter for sterilization 06/22/2013   Normal delivery 09/05/2011    Past Surgical History:  Procedure Laterality Date   NO PAST SURGERIES     TUBAL LIGATION N/A 06/22/2013   Procedure: POST PARTUM TUBAL LIGATION;  Surgeon: Elveria Mungo, MD;  Location: [facility];  Service: Gynecology;  Laterality: N/A;    OB History     Gravida  4   Para  4   Term  4   Preterm      AB      Living  4      SAB      IAB      Ectopic      Multiple      Live Births  4            Home Medications    Prior to Admission medications   Medication Sig Start Date End Date Taking? Authorizing Provider  diphenhydrAMINE  (BENADRYL ) 25 MG tablet  Take 1 tablet (25 mg total) by mouth every 6 (six) hours as needed. 03/01/24  Yes Solymar Grace  N, FNP  hydrOXYzine  (ATARAX ) 25 MG tablet Take 1 tablet (25 mg total) by mouth every 6 (six) hours. 12/26/21  Yes Graham, Laura E, PA-C  metFORMIN  (GLUCOPHAGE ) 500 MG tablet Take 1 tablet (500 mg total) by mouth daily with breakfast. 11/28/21  Yes Roemhildt, Lorin T, PA-C  metroNIDAZOLE  (METROGEL ) 0.75 % vaginal gel INSERT 1 APPLICATORFUL VAGINALLY TWICE DAILY 03/27/22   Rudy Carlin LABOR, MD  metroNIDAZOLE  (METROGEL ) 0.75 % vaginal gel INSERT 1 APPLICATORFUL VAGINALLY TWICE DAILY 03/27/22   Rudy Carlin LABOR, MD    Family History Family History  Problem Relation Age of Onset   Diabetes Mother    Hypertension Mother    Diabetes Maternal Grandmother    Cancer Neg Hx     Social History Social History   Tobacco Use   Smoking status: Never   Smokeless tobacco: Never  Vaping Use   Vaping status: Never Used  Substance Use Topics   Alcohol use: No    Alcohol/week: 0.0 standard  drinks of alcohol   Drug use: No     Allergies   Shellfish allergy   Review of Systems Review of Systems  Per HPI  Physical Exam Triage Vital Signs ED Triage Vitals [03/01/24 0922]  Encounter Vitals Group     BP (!) 141/81     Girls Systolic BP Percentile      Girls Diastolic BP Percentile      Boys Systolic BP Percentile      Boys Diastolic BP Percentile      Pulse Rate 88     Resp 18     Temp 97.8 F (36.6 C)     Temp Source Oral     SpO2 96 %     Weight      Height      Head Circumference      Peak Flow      Pain Score      Pain Loc      Pain Education      Exclude from Growth Chart    No data found.  Updated Vital Signs BP (!) 141/81 (BP Location: Left Arm)   Pulse 88   Temp 97.8 F (36.6 C) (Oral)   Resp 18   LMP 02/07/2024 (Approximate)   SpO2 96%   Visual Acuity Right Eye Distance:   Left Eye Distance:   Bilateral Distance:    Right Eye Near:   Left Eye Near:     Bilateral Near:     Physical Exam Vitals and nursing note reviewed.  Constitutional:      Appearance: Normal appearance.  HENT:     Head: Normocephalic and atraumatic.     Right Ear: External ear normal.     Left Ear: External ear normal.     Nose: Nose normal.     Mouth/Throat:     Mouth: Mucous membranes are moist.  Eyes:     Conjunctiva/sclera: Conjunctivae normal.  Cardiovascular:     Rate and Rhythm: Normal rate.  Pulmonary:     Effort: Pulmonary effort is normal. No respiratory distress.  Skin:    General: Skin is warm and dry.     Findings: Erythema and rash present. Rash is urticarial.         Comments: Macular site of erythema and urticaria to L posterior thigh, palmar size   Neurological:     General: No focal deficit present.     Mental Status: She is alert and oriented to person, place, and time.  Psychiatric:        Mood and Affect: Mood normal.        Behavior: Behavior normal. Behavior is cooperative.      UC Treatments / Results  Labs (all labs ordered are listed, but only abnormal results are displayed) Labs Reviewed - No data to display  EKG   Radiology No results found.  Procedures Procedures (including critical care time)  Medications Ordered in UC Medications  dexamethasone  (DECADRON ) injection 10 mg (has no administration in time range)    Initial Impression / Assessment and Plan / UC Course  I have reviewed the triage vital signs and the nursing notes.  Pertinent labs & imaging results that were available during my care of the patient were reviewed by me and considered in my medical decision making (see chart for details).  Vitals and triage reviewed, patient is hemodynamically stable.  Urticarial macular site of erythema to the left posterior thigh with pinpoint central scabbing, could be due to bug  bite.  Patient appears to have good control of type 2 diabetes, offered IM Decadron  in clinic for erythema and inflammation.  Continue  topical steroids and start antihistamine.  Suspect allergic contact dermatitis.  Area is localized, without necrosis or drainage.  Afebrile and without streaking.  Lower concern for cellulitis at this time.  Plan of care, follow-up care return precautions given, no questions at this time.    Final Clinical Impressions(s) / UC Diagnoses   Final diagnoses:  Insect bite of left thigh, initial encounter  Allergic contact dermatitis, unspecified trigger     Discharge Instructions      We have given you a steroid injection that should help with the itch and inflammation.  Continue to use topical hydrocortisone  twice daily to the area to help with itching and inflammation.  Tonight before bed you can take the Benadryl , can take this every 6 hours.  This may cause drowsiness or sedation.  Steroid shot, topical steroids and oral antihistamines and, nation should help with the itching and inflammation of the area.  If he did not have any improvement, you develop fever, drainage, or new concerning symptoms please return to clinic for reevaluation.      ED Prescriptions     Medication Sig Dispense Auth. Provider   diphenhydrAMINE  (BENADRYL ) 25 MG tablet Take 1 tablet (25 mg total) by mouth every 6 (six) hours as needed. 30 tablet Dreama, Nima Kemppainen  N, FNP      PDMP not reviewed this encounter.   Dreama Windsor Goeken  N, FNP 03/01/24 [phone]

## 2024-03-01 NOTE — Discharge Instructions (Addendum)
 We have given you a steroid injection that should help with the itch and inflammation.  Continue to use topical hydrocortisone  twice daily to the area to help with itching and inflammation.  Tonight before bed you can take the Benadryl , can take this every 6 hours.  This may cause drowsiness or sedation.  Steroid shot, topical steroids and oral antihistamines and, nation should help with the itching and inflammation of the area.  If he did not have any improvement, you develop fever, drainage, or new concerning symptoms please return to clinic for reevaluation.

## 2024-03-01 NOTE — ED Triage Notes (Signed)
 Patient presents to the office for insect bite to her left upper thigh area x 2 days. Patient has been using benadryl  cream.

## 2024-03-12 ENCOUNTER — Ambulatory Visit (HOSPITAL_COMMUNITY)
Admission: EM | Admit: 2024-03-12 | Discharge: 2024-03-12 | Disposition: A | Discharge status: Home / Self Care | Attending: Physician Assistant | Admitting: Physician Assistant

## 2024-03-12 ENCOUNTER — Other Ambulatory Visit: Payer: Self-pay

## 2024-03-12 ENCOUNTER — Encounter (HOSPITAL_COMMUNITY): Payer: Self-pay | Admitting: *Deleted

## 2024-03-12 DIAGNOSIS — N3000 Acute cystitis without hematuria: Secondary | ICD-10-CM

## 2024-03-12 DIAGNOSIS — R1030 Lower abdominal pain, unspecified: Secondary | ICD-10-CM | POA: Diagnosis present

## 2024-03-12 DIAGNOSIS — Z302 Encounter for sterilization: Secondary | ICD-10-CM

## 2024-03-12 LAB — POCT URINALYSIS DIP (MANUAL ENTRY)
Bilirubin, UA: NEGATIVE
Glucose, UA: NEGATIVE mg/dL
Nitrite, UA: NEGATIVE
Spec Grav, UA: 1.02 (ref 1.010–1.025)
Urobilinogen, UA: 1 U/dL
pH, UA: 7.5 (ref 5.0–8.0)

## 2024-03-12 LAB — POCT URINE PREGNANCY: Preg Test, Ur: NEGATIVE

## 2024-03-12 MED ORDER — SULFAMETHOXAZOLE-TRIMETHOPRIM 800-160 MG PO TABS: 1.0000 | ORAL_TABLET | Freq: Two times a day (BID) | ORAL | 0 refills | Status: AC

## 2024-03-12 NOTE — ED Triage Notes (Signed)
 PT reports ABD cramping that started WED. Pt reports she never has cramps with period. Pt reports the ABD cramping continues even though she is at the end of her period.

## 2024-03-12 NOTE — ED Provider Notes (Signed)
 [facility]    CSN: [id number] Arrival date & time: 03/12/24  1132      History   Chief Complaint Chief Complaint  Patient presents with   Abdominal Cramping    HPI Brooke Lewis is a 48 y.o. female.   Patient here c/w abdominal cramping and back pain x 4 days.  Deneis f/c, n/v/d/c, hematuria, vaginal discharge, dysuria, frequency. LMP now.  Pain worsening.     Past Medical History:  Diagnosis Date   Medical history non-contributory    No pertinent past medical history     Patient Active Problem List   Diagnosis Date Noted   Active labor 06/22/2013   Encounter for sterilization 06/22/2013   Normal delivery 09/05/2011    Past Surgical History:  Procedure Laterality Date   NO PAST SURGERIES     TUBAL LIGATION N/A 06/22/2013   Procedure: POST PARTUM TUBAL LIGATION;  Surgeon: Elveria Mungo, MD;  Location: [facility];  Service: Gynecology;  Laterality: N/A;    OB History     Gravida  4   Para  4   Term  4   Preterm      AB      Living  4      SAB      IAB      Ectopic      Multiple      Live Births  4            Home Medications    Prior to Admission medications   Medication Sig Start Date End Date Taking? Authorizing Provider  sulfamethoxazole-trimethoprim (BACTRIM DS) 800-160 MG tablet Take 1 tablet by mouth 2 (two) times daily for 4 days. 03/12/24 03/16/24 Yes Juleen Rush, PA-C  diphenhydrAMINE  (BENADRYL ) 25 MG tablet Take 1 tablet (25 mg total) by mouth every 6 (six) hours as needed. 03/01/24   Dreama, [place]  N, FNP  hydrOXYzine  (ATARAX ) 25 MG tablet Take 1 tablet (25 mg total) by mouth every 6 (six) hours. 12/26/21   Graham, Laura E, PA-C  metFORMIN  (GLUCOPHAGE ) 500 MG tablet Take 1 tablet (500 mg total) by mouth daily with breakfast. 11/28/21   Roemhildt, Lorin T, PA-C  metroNIDAZOLE  (METROGEL ) 0.75 % vaginal gel INSERT 1 APPLICATORFUL VAGINALLY TWICE DAILY 03/27/22   Rudy Carlin LABOR, MD  metroNIDAZOLE  (METROGEL ) 0.75  % vaginal gel INSERT 1 APPLICATORFUL VAGINALLY TWICE DAILY 03/27/22   Rudy Carlin LABOR, MD    Family History Family History  Problem Relation Age of Onset   Diabetes Mother    Hypertension Mother    Diabetes Maternal Grandmother    Cancer Neg Hx     Social History Social History   Tobacco Use   Smoking status: Never   Smokeless tobacco: Never  Vaping Use   Vaping status: Never Used  Substance Use Topics   Alcohol use: No    Alcohol/week: 0.0 standard drinks of alcohol   Drug use: No     Allergies   Shellfish allergy   Review of Systems Review of Systems  Constitutional:  Negative for chills, fatigue and fever.  Gastrointestinal:  Positive for abdominal pain. Negative for constipation, diarrhea, nausea and vomiting.  Genitourinary:  Negative for decreased urine volume, difficulty urinating, dysuria, flank pain, frequency, genital sores, hematuria, urgency, vaginal discharge and vaginal pain.  Musculoskeletal:  Positive for back pain. Negative for arthralgias and myalgias.  Skin:  Negative for color change, rash and wound.  Allergic/Immunologic: Negative for environmental allergies and immunocompromised state.  Neurological:  Negative for headaches.  Hematological:  Negative for adenopathy. Does not bruise/bleed easily.  Psychiatric/Behavioral:  Negative for sleep disturbance.      Physical Exam Triage Vital Signs ED Triage Vitals  Encounter Vitals Group     BP 03/12/24 1259 (!) 146/81     Girls Systolic BP Percentile --      Girls Diastolic BP Percentile --      Boys Systolic BP Percentile --      Boys Diastolic BP Percentile --      Pulse Rate 03/12/24 1259 77     Resp 03/12/24 1259 16     Temp 03/12/24 1259 98.7 F (37.1 C)     Temp src --      SpO2 03/12/24 1259 97 %     Weight --      Height --      Head Circumference --      Peak Flow --      Pain Score 03/12/24 1257 6     Pain Loc --      Pain Education --      Exclude from Growth Chart --     No data found.  Updated Vital Signs BP (!) 167/103   Pulse 77   Temp 98.7 F (37.1 C)   Resp 16   LMP 03/12/2024 (Approximate)   SpO2 97%   Visual Acuity Right Eye Distance:   Left Eye Distance:   Bilateral Distance:    Right Eye Near:   Left Eye Near:    Bilateral Near:     Physical Exam Vitals and nursing note reviewed.  Constitutional:      General: She is not in acute distress.    Appearance: Normal appearance. She is well-developed. She is not ill-appearing.  HENT:     Head: Normocephalic and atraumatic.     Nose: Nose normal.  Eyes:     General: No scleral icterus.    Extraocular Movements: Extraocular movements intact.     Conjunctiva/sclera: Conjunctivae normal.  Pulmonary:     Effort: Pulmonary effort is normal. No respiratory distress.  Abdominal:     General: Bowel sounds are normal.     Palpations: Abdomen is soft.     Tenderness: There is abdominal tenderness in the suprapubic area. There is no right CVA tenderness, left CVA tenderness, guarding or rebound. Negative signs include Murphy's sign, Rovsing's sign, McBurney's sign, psoas sign and obturator sign.  Musculoskeletal:     Cervical back: Normal range of motion and neck supple. No rigidity.     Lumbar back: No spasms, tenderness or bony tenderness. Normal range of motion. Negative right straight leg raise test and negative left straight leg raise test.  Skin:    General: Skin is warm and dry.  Neurological:     General: No focal deficit present.     Mental Status: She is alert and oriented to person, place, and time.     Motor: No weakness.     Gait: Gait normal.  Psychiatric:        Mood and Affect: Mood normal.        Behavior: Behavior normal.      UC Treatments / Results  Labs (all labs ordered are listed, but only abnormal results are displayed) Labs Reviewed  POCT URINALYSIS DIP (MANUAL ENTRY) - Abnormal; Notable for the following components:      Result Value   Clarity, UA  turbid (*)    Ketones, POC UA trace (5) (*)  Blood, UA large (*)    Protein Ur, POC trace (*)    Leukocytes, UA Small (1+) (*)    All other components within normal limits  URINE CULTURE  POCT URINE PREGNANCY    EKG   Radiology No results found.  Procedures Procedures (including critical care time)  Medications Ordered in UC Medications - No data to display  Initial Impression / Assessment and Plan / UC Course  I have reviewed the triage vital signs and the nursing notes.  Pertinent labs & imaging results that were available during my care of the patient were reviewed by me and considered in my medical decision making (see chart for details).     Lab results available in 2 - 5 days Drink plenty of water Final Clinical Impressions(s) / UC Diagnoses   Final diagnoses:  Lower abdominal pain  Encounter for sterilization  Normal delivery  Acute cystitis without hematuria   Discharge Instructions   None    ED Prescriptions     Medication Sig Dispense Auth. Provider   sulfamethoxazole-trimethoprim (BACTRIM DS) 800-160 MG tablet Take 1 tablet by mouth 2 (two) times daily for 4 days. 8 tablet Juleen Rush, PA-C      PDMP not reviewed this encounter.   Juleen Rush, PA-C 03/12/24 1436

## 2024-03-14 ENCOUNTER — Ambulatory Visit (HOSPITAL_COMMUNITY): Payer: Self-pay

## 2024-03-14 LAB — URINE CULTURE: Culture: >=100000 — AB
